# Patient Record
Sex: Male | Born: 1995 | Race: Black or African American | Hispanic: No | Marital: Single | Smoking: Never smoker
Health system: Southern US, Community
[De-identification: ages and names within clinical notes are randomized; demographics above are authoritative.]

## PROBLEM LIST (undated history)

## (undated) DIAGNOSIS — R002 Palpitations: Secondary | ICD-10-CM

## (undated) DIAGNOSIS — J45909 Unspecified asthma, uncomplicated: Secondary | ICD-10-CM

## (undated) HISTORY — PX: NO PAST SURGERIES: SHX2092

## (undated) HISTORY — DX: Palpitations: R00.2

---
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
## 2016-11-20 ENCOUNTER — Emergency Department (HOSPITAL_COMMUNITY)
Admission: EM | Admit: 2016-11-20 | Discharge: 2016-11-21 | Disposition: A | Discharge status: Home / Self Care | Payer: BLUE CROSS/BLUE SHIELD | Attending: Emergency Medicine | Admitting: Emergency Medicine

## 2016-11-20 ENCOUNTER — Encounter (HOSPITAL_COMMUNITY): Payer: Self-pay

## 2016-11-20 DIAGNOSIS — R0602 Shortness of breath: Secondary | ICD-10-CM | POA: Diagnosis present

## 2016-11-20 DIAGNOSIS — Z79899 Other long term (current) drug therapy: Secondary | ICD-10-CM | POA: Diagnosis not present

## 2016-11-20 DIAGNOSIS — R002 Palpitations: Secondary | ICD-10-CM | POA: Insufficient documentation

## 2016-11-20 MED ORDER — ALBUTEROL SULFATE (2.5 MG/3ML) 0.083% IN NEBU
5.0000 mg | INHALATION_SOLUTION | Freq: Once | RESPIRATORY_TRACT | Status: DC
  Filled 2016-11-20: qty 6

## 2016-11-20 NOTE — ED Triage Notes (Addendum)
Pt c/o SOB starting at the beginning of last week. He also states that his chest feels tight and he has been experiencing nasal congestion. States that today, he began experiencing dizziness. A&Ox4. Ambulatory.

## 2016-11-20 NOTE — ED Provider Notes (Signed)
[facility] Provider Note   CSN: [id number] Arrival date & time: 11/20/16  2333 By signing my name below, I, Levon Hedger, attest that this documentation has been prepared under the direction and in the presence of Pricilla Loveless, MD . Electronically Signed: Levon Hedger, Scribe. 11/21/2016. 12:20 AM.   History   Chief Complaint Chief Complaint  Patient presents with  . Shortness of Breath    HPI Guy Campbell is a 21 y.o. male who presents to the Emergency Department complaining of intermittent, recurrent shortness of breath onset one week ago. Per pt, he often experiences this at night before going to bed or before work. He reports associated intermittent  palpitations, dizziness, chest tightness, nonproductive cough, congestion and rhinorrhea. Per pt, he experienced these symptoms 2x tonight, once while watching basketball and again while en route to the ED. He has taken cough drops, Benadryl, and used his inhaler with no significant relief. These episodes are not preceded by the use of his inhaler. Pt states he does not believe his symptoms are related to asthma. Pt  started drinking soda two months ago, and denies any other caffeine intake. No recent travel. He denies any LOC, CP, nausea, vomiting, diarrhea, fever, leg pain or swelling.   The history is provided by the patient. No language interpreter was used.    History reviewed. No pertinent past medical history.  Patient Active Problem List   Diagnosis Date Noted  . Pes planus of both feet 03/21/2014    History reviewed. No pertinent surgical history.   Home Medications    Prior to Admission medications   Medication Sig Start Date End Date Taking? Authorizing Provider  albuterol (PROVENTIL HFA;VENTOLIN HFA) 108 (90 Base) MCG/ACT inhaler Inhale 2 puffs into the lungs every 6 (six) hours as needed. 11/21/16   Pricilla Loveless, MD    Family History History reviewed. No pertinent family history.  Social  History Social History  Substance Use Topics  . Smoking status: Never Smoker  . Smokeless tobacco: Not on file  . Alcohol use No    Allergies   Patient has no known allergies.  Review of Systems Review of Systems  Constitutional: Negative for fever.  Respiratory: Positive for cough, chest tightness and shortness of breath.   Cardiovascular: Positive for palpitations. Negative for chest pain and leg swelling.  Gastrointestinal: Negative for nausea and vomiting.  Musculoskeletal: Negative for myalgias.  Neurological: Positive for dizziness.  All other systems reviewed and are negative.  Physical Exam Updated Vital Signs BP 119/76 (BP Location: Left Arm)   Pulse (!) 55   Temp 97.8 F (36.6 C) (Oral)   Resp 20   Ht  (1.905 m)   Wt 151 lb 9.6 oz (68.8 kg)   SpO2 100%   BMI 18.95 kg/m   Physical Exam  Constitutional: He is oriented to person, place, and time. He appears well-developed and well-nourished.  HENT:  Head: Normocephalic and atraumatic.  Right Ear: External ear normal.  Left Ear: External ear normal.  Nose: Nose normal.  Eyes: Right eye exhibits no discharge. Left eye exhibits no discharge.  Neck: Neck supple.  Cardiovascular: Regular rhythm and normal heart sounds.  Bradycardia present.   Pulses:      Radial pulses are 2+ on the right side, and 2+ on the left side.  Pulmonary/Chest: Effort normal and breath sounds normal. He has no wheezes.  Abdominal: Soft. There is no tenderness.  Musculoskeletal: He exhibits no edema.  Neurological: He is alert  and oriented to person, place, and time.  Skin: Skin is warm and dry.  Nursing note and vitals reviewed.  ED Treatments / Results  DIAGNOSTIC STUDIES:  Oxygen Saturation is 100% on RA, normal by my interpretation.    COORDINATION OF CARE:  12:13 AM Discussed treatment plan with pt at bedside and pt agreed to plan.   Labs (all labs ordered are listed, but only abnormal results are displayed) Labs  Reviewed  BASIC METABOLIC PANEL - Abnormal; Notable for the following:       Result Value   Creatinine, Ser 1.26 (*)    All other components within normal limits  CBC WITH DIFFERENTIAL/PLATELET    EKG  EKG Interpretation  Date/Time:  Sunday November 21 2016 00:20:19 EDT Ventricular Rate:  49 PR Interval:    QRS Duration: 83 QT Interval:  448 QTC Calculation: 405 R Axis:   83 Text Interpretation:  Sinus bradycardia LVH by voltage No old tracing to compare Confirmed by Panfilo Ketchum MD, Gerline Ratto [phone]) on 11/21/2016 12:24:16 AM Also confirmed by Criss Alvine MD, Gini Caputo [phone]), editor Misty Stanley [phone])  on 11/21/2016 7:48:04 AM       Radiology Dg Chest 2 View  Result Date: 11/21/2016 CLINICAL DATA:  Dyspnea and chest tightness EXAM: CHEST  2 VIEW COMPARISON:  None. FINDINGS: The heart size and mediastinal contours are within normal limits. Both lungs are clear. The visualized skeletal structures are unremarkable. IMPRESSION: No active cardiopulmonary disease. Electronically Signed   By: Tollie Eth M.D.   On: 11/21/2016 00:36    Procedures Procedures (including critical care time)  Medications Ordered in ED Medications - No data to display   Initial Impression / Assessment and Plan / ED Course  I have reviewed the triage vital signs and the nursing notes.  Pertinent labs & imaging results that were available during my care of the patient were reviewed by me and considered in my medical decision making (see chart for details).     Patient has remained asymptomatic. No significant arrhythmias noted on telemetry. No clear cause of symptoms. Chart review shows chronic bradycardia. Will recommend cutting back on caffeine, possibly the dayquil/nyquil is contributing. f/u with cardiology as outpatient, likely needs a holter monitor.   Final Clinical Impressions(s) / ED Diagnoses   Final diagnoses:  Palpitations   New Prescriptions Discharge Medication List as of 11/21/2016  1:34  AM     I personally performed the services described in this documentation, which was scribed in my presence. The recorded information has been reviewed and is accurate.    Pricilla Loveless, MD 11/21/16 [phone]

## 2016-11-21 ENCOUNTER — Emergency Department (HOSPITAL_COMMUNITY): Payer: BLUE CROSS/BLUE SHIELD

## 2016-11-21 LAB — BASIC METABOLIC PANEL
Anion gap: 10 (ref 5–15)
BUN: 16 mg/dL (ref 6–20)
CHLORIDE: 104 mmol/L (ref 101–111)
CO2: 25 mmol/L (ref 22–32)
Calcium: 9.3 mg/dL (ref 8.9–10.3)
Creatinine, Ser: 1.26 mg/dL — ABNORMAL HIGH (ref 0.61–1.24)
GFR calc Af Amer: >60 mL/min (ref >60)
GFR calc non Af Amer: >60 mL/min (ref >60)
Glucose, Bld: 87 mg/dL (ref 65–99)
POTASSIUM: 4 mmol/L (ref 3.5–5.1)
Sodium: 139 mmol/L (ref 135–145)

## 2016-11-21 LAB — CBC WITH DIFFERENTIAL/PLATELET
Basophils Absolute: 0 10*3/uL (ref 0.0–0.1)
Basophils Relative: 0 %
EOS PCT: 6 %
Eosinophils Absolute: 0.3 10*3/uL (ref 0.0–0.7)
HCT: 43.1 % (ref 39.0–52.0)
Hemoglobin: 14.8 g/dL (ref 13.0–17.0)
LYMPHS ABS: 1.9 10*3/uL (ref 0.7–4.0)
LYMPHS PCT: 39 %
MCH: 32.8 pg (ref 26.0–34.0)
MCHC: 34.3 g/dL (ref 30.0–36.0)
MCV: 95.6 fL (ref 78.0–100.0)
MONO ABS: 0.5 10*3/uL (ref 0.1–1.0)
Monocytes Relative: 11 %
Neutro Abs: 2.1 10*3/uL (ref 1.7–7.7)
Neutrophils Relative %: 44 %
Platelets: 179 10*3/uL (ref 150–400)
RBC: 4.51 MIL/uL (ref 4.22–5.81)
RDW: 12.1 % (ref 11.5–15.5)
WBC: 4.8 10*3/uL (ref 4.0–10.5)

## 2016-11-21 MED ORDER — ALBUTEROL SULFATE HFA 108 (90 BASE) MCG/ACT IN AERS: 2.0000 | INHALATION_SPRAY | Freq: Four times a day (QID) | RESPIRATORY_TRACT | 1 refills | Status: DC | PRN

## 2016-11-21 NOTE — Discharge Instructions (Signed)
Follow up with cardiology for further evaluation of your palpitations. They may do a Holter Monitor which helps monitor your heart for arrhythmias.

## 2016-11-21 NOTE — ED Notes (Signed)
Patient transported to X-ray 

## 2016-11-21 NOTE — ED Notes (Signed)
ED Provider at bedside. 

## 2018-09-28 IMAGING — CR DG CHEST 2V
2 series · 2 of 2 positions shown · non-contrast
Comparison: None.

CLINICAL DATA: Dyspnea and chest tightness

EXAM:
CHEST  2 VIEW

[w chest pa]
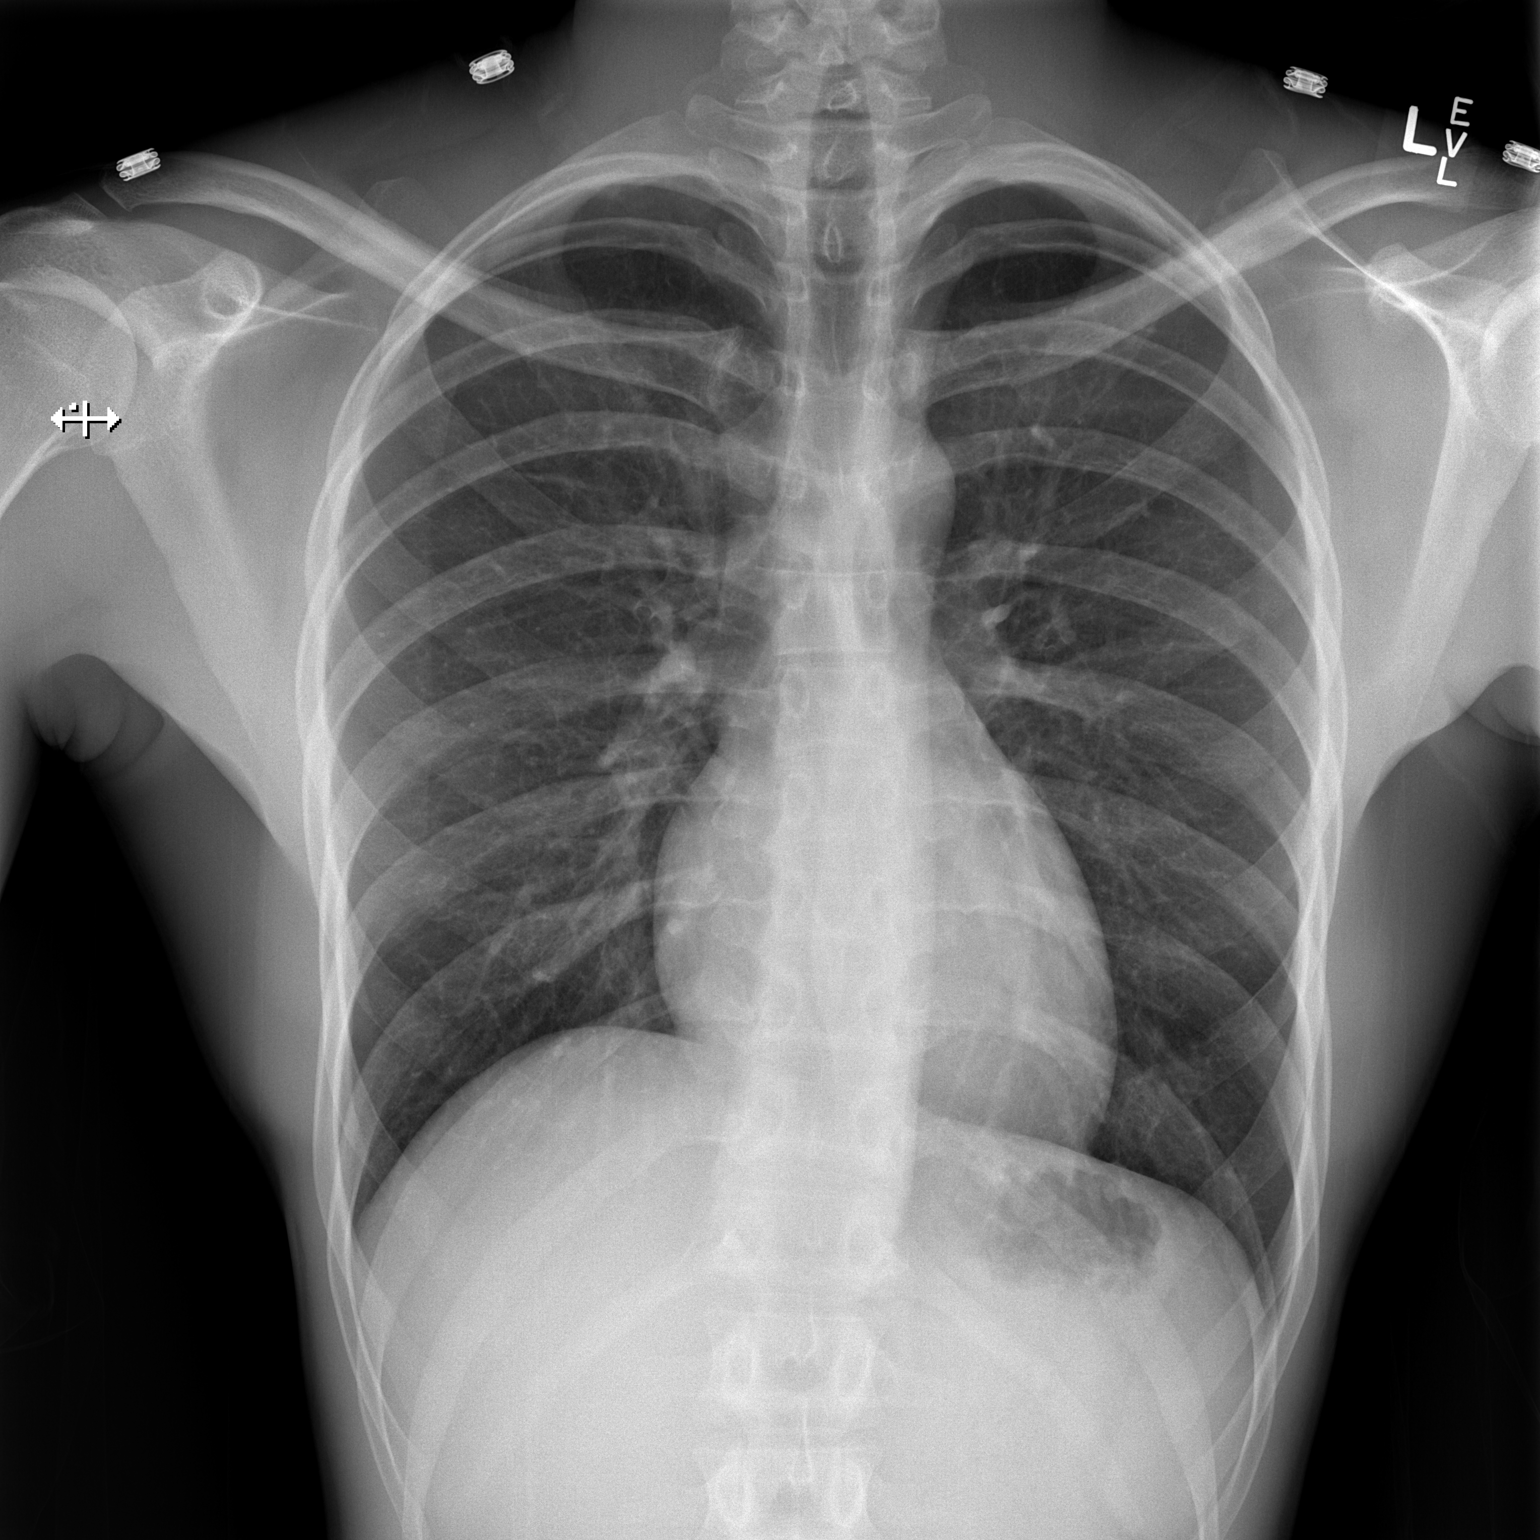

[w chest lat]
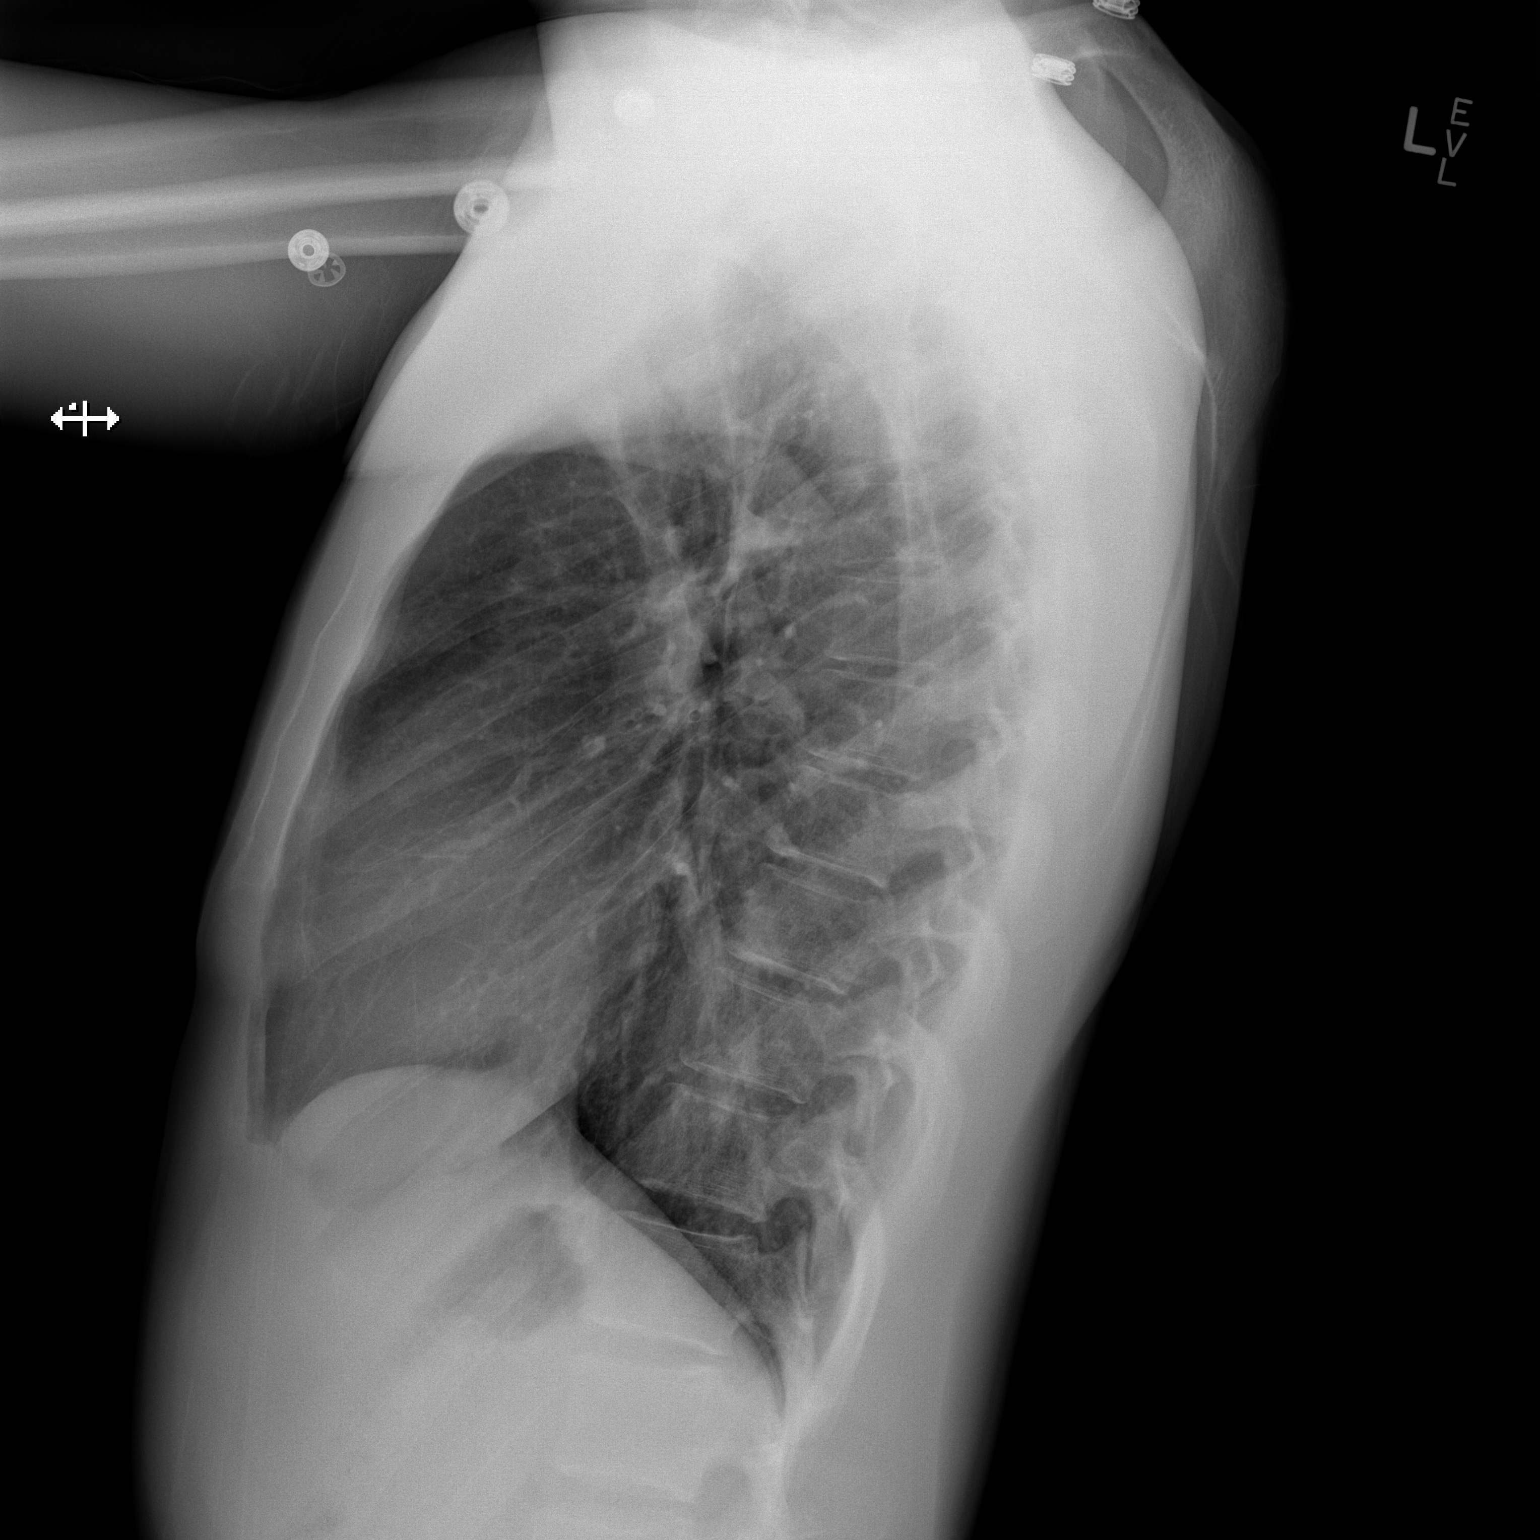

[2 of 2 positions shown; findings below may reference images not displayed]

FINDINGS: The heart size and mediastinal contours are within normal limits.
Both lungs are clear. The visualized skeletal structures are
unremarkable.
IMPRESSION: No active cardiopulmonary disease.

## 2022-02-20 ENCOUNTER — Ambulatory Visit (HOSPITAL_COMMUNITY)
Admission: EM | Admit: 2022-02-20 | Discharge: 2022-02-20 | Disposition: A | Discharge status: Home / Self Care | Payer: BC Managed Care – PPO

## 2022-02-20 ENCOUNTER — Encounter (HOSPITAL_COMMUNITY): Payer: Self-pay

## 2022-02-20 DIAGNOSIS — M542 Cervicalgia: Secondary | ICD-10-CM | POA: Diagnosis not present

## 2022-02-20 HISTORY — DX: Unspecified asthma, uncomplicated: J45.909

## 2022-02-20 NOTE — Discharge Instructions (Addendum)
I recommend ibuprofen 800 mg every 6 hours. Try warm compress or ice to the area. Gentle stretching of the neck, light massage.  Please follow up with your primary care provider.

## 2022-02-20 NOTE — ED Triage Notes (Signed)
Pt states he has a tingling feeling in his left neck that goes into his left face and shoulder on and off for the past 2 days. States he went to the chiropractor today for an adjustment with no relief.

## 2022-02-21 NOTE — ED Provider Notes (Signed)
[facility]    CSN: [id number] Arrival date & time: 02/20/22  1709      History   Chief Complaint Chief Complaint  Patient presents with   Neck Pain    HPI Guy Campbell is a 26 y.o. male.  Presents with 2-day history of tingling feeling on the left side of his neck.  Feels it is also in his left face.  It is very subtle, denies any pain.  He has no problems with neck and shoulder range of motion. Has not tried anything for his symptoms.  Went to chiropractor earlier for adjustment without relief.  Denies fever, chills, vision changes, blurry vision, burning sensation or rash, hearing changes, sore throat or mouth pain, cough, shortness of breath, abdominal pain, vomiting/diarrhea.  Only symptom is the sensation of tingling on the left neck.  Past Medical History:  Diagnosis Date   Asthma     Patient Active Problem List   Diagnosis Date Noted   Pes planus of both feet 03/21/2014    History reviewed. No pertinent surgical history.     Home Medications    Prior to Admission medications   Medication Sig Start Date End Date Taking? Authorizing Provider  albuterol (PROVENTIL HFA;VENTOLIN HFA) 108 (90 Base) MCG/ACT inhaler Inhale 2 puffs into the lungs every 6 (six) hours as needed. 11/21/16   Pricilla Loveless, MD    Family History History reviewed. No pertinent family history.  Social History Social History   Tobacco Use   Smoking status: Never  Substance Use Topics   Alcohol use: No   Drug use: No     Allergies   Patient has no known allergies.   Review of Systems Review of Systems   Physical Exam Triage Vital Signs ED Triage Vitals  Enc Vitals Group     BP 02/20/22 1730 (!) 154/88     Pulse Rate 02/20/22 1730 (!) 58     Resp 02/20/22 1730 16     Temp 02/20/22 1730 98.2 F (36.8 C)     Temp Source 02/20/22 1730 Oral     SpO2 02/20/22 1730 98 %     Weight --      Height --      Head Circumference --      Peak Flow --      Pain  Score 02/20/22 1731 0     Pain Loc --      Pain Edu? --      Excl. in GC? --    No data found.  Updated Vital Signs BP (!) 154/88 (BP Location: Left Arm)   Pulse (!) 58   Temp 98.2 F (36.8 C) (Oral)   Resp 16   SpO2 98%    Physical Exam Vitals and nursing note reviewed.  Constitutional:      General: He is not in acute distress.    Appearance: Normal appearance.  HENT:     Head: Normocephalic and atraumatic.     Right Ear: Tympanic membrane and ear canal normal.     Left Ear: Tympanic membrane and ear canal normal.     Nose: Nose normal.     Mouth/Throat:     Mouth: Mucous membranes are moist.     Pharynx: Oropharynx is clear.  Eyes:     Extraocular Movements: Extraocular movements intact.     Conjunctiva/sclera: Conjunctivae normal.     Pupils: Pupils are equal, round, and reactive to light.  Neck:     Trachea: Trachea  normal.  Cardiovascular:     Rate and Rhythm: Normal rate and regular rhythm.     Heart sounds: Normal heart sounds.  Pulmonary:     Effort: Pulmonary effort is normal. No respiratory distress.     Breath sounds: Normal breath sounds.  Abdominal:     Palpations: Abdomen is soft.     Tenderness: There is no abdominal tenderness.  Musculoskeletal:        General: Normal range of motion.     Right shoulder: Normal.     Left shoulder: Normal.     Cervical back: Normal, full passive range of motion without pain and normal range of motion. No rigidity or bony tenderness. No pain with movement, spinous process tenderness or muscular tenderness. Normal range of motion.  Lymphadenopathy:     Cervical: No cervical adenopathy.  Neurological:     General: No focal deficit present.     Mental Status: He is alert and oriented to person, place, and time.     Cranial Nerves: Cranial nerves 2-12 are intact. No cranial nerve deficit or facial asymmetry.     Sensory: Sensation is intact. No sensory deficit.     Motor: Motor function is intact. No weakness, tremor  or atrophy.     Coordination: Coordination is intact. Coordination normal.     Gait: Gait is intact. Gait normal.     Comments: Strength 5/5 all extremities, pulses strong and sensation intact throughout     UC Treatments / Results  Labs (all labs ordered are listed, but only abnormal results are displayed) Labs Reviewed - No data to display  EKG  Radiology No results found.  Procedures Procedures  Medications Ordered in UC Medications - No data to display  Initial Impression / Assessment and Plan / UC Course  I have reviewed the triage vital signs and the nursing notes.  Pertinent labs & imaging results that were available during my care of the patient were reviewed by me and considered in my medical decision making (see chart for details).  Unclear etiology but he is overall well-appearing with no neurological deficit, no obvious rash or skin changes of the face or neck.  No muscular pain.  Could be some sort of nerve issue versus inflammation.  I set him up with a primary care provider, appointment for next week.  Discussed with patient that primary care will be the moderator for whatever specialty he may need to see, ear specialist versus orthopedics versus neurology etc if symptoms are persistent. In the meantime I recommend ibuprofen, warm compress or ice to the area.  Gentle stretching and light massage may provide some relief.  He will try symptomatic care at home, watch and wait for any change in symptoms.  Strict return precautions.  Patient agrees to plan and he is discharged in stable condition.  Final Clinical Impressions(s) / UC Diagnoses   Final diagnoses:  Neck pain on left side     Discharge Instructions      I recommend ibuprofen 800 mg every 6 hours. Try warm compress or ice to the area. Gentle stretching of the neck, light massage.  Please follow up with your primary care provider.    ED Prescriptions   None    PDMP not reviewed this  encounter.   Zanae Kuehnle, Lurena Joiner, PA-C 02/21/22 1011

## 2022-02-22 ENCOUNTER — Encounter: Payer: Self-pay | Admitting: Family

## 2022-02-22 ENCOUNTER — Ambulatory Visit (INDEPENDENT_AMBULATORY_CARE_PROVIDER_SITE_OTHER): Payer: BC Managed Care – PPO | Admitting: Family

## 2022-02-22 VITALS — BP 116/70 | HR 66 | Temp 98.8°F | Resp 16 | Ht 73.0 in | Wt 154.5 lb

## 2022-02-22 DIAGNOSIS — Z13220 Encounter for screening for lipoid disorders: Secondary | ICD-10-CM | POA: Insufficient documentation

## 2022-02-22 DIAGNOSIS — J4520 Mild intermittent asthma, uncomplicated: Secondary | ICD-10-CM

## 2022-02-22 DIAGNOSIS — R002 Palpitations: Secondary | ICD-10-CM | POA: Insufficient documentation

## 2022-02-22 DIAGNOSIS — R001 Bradycardia, unspecified: Secondary | ICD-10-CM

## 2022-02-22 DIAGNOSIS — F411 Generalized anxiety disorder: Secondary | ICD-10-CM

## 2022-02-22 DIAGNOSIS — R1013 Epigastric pain: Secondary | ICD-10-CM | POA: Insufficient documentation

## 2022-02-22 MED ORDER — OMEPRAZOLE 20 MG PO CPDR: 20.0000 mg | DELAYED_RELEASE_CAPSULE | Freq: Every day | ORAL | 3 refills | Status: DC

## 2022-02-22 MED ORDER — ESCITALOPRAM OXALATE 10 MG PO TABS: 10.0000 mg | ORAL_TABLET | Freq: Every day | ORAL | 1 refills | Status: DC

## 2022-02-22 MED ORDER — HYDROXYZINE PAMOATE 50 MG PO CAPS: 50.0000 mg | ORAL_CAPSULE | Freq: Three times a day (TID) | ORAL | 0 refills | Status: DC | PRN

## 2022-02-22 NOTE — Progress Notes (Signed)
New Patient Office Visit  Subjective:  Patient ID: Guy Campbell, male    DOB: 01-30-1996  Age: 26 y.o. MRN: [id number]  CC:  Chief Complaint  Patient presents with   Establish Care   Abdominal Pain    Started last night. Not pain just uncomfotable    HPI Guy Campbell is here to establish care as a new patient.  Prior provider was:has been for a while.  Pt is without acute concerns.   Last week with some abdominal bloating, and slightly over the last one week. Does feel heart burn at times, not everyday. Does eat a lot of fast food, fried fatty foods. Not much spicy foods. No constipation or diarrhea. Some extra gas at times, not all of the time. Not excessive burping. No nausea. No vomiting.   Last night started with a feeling of abdominal fullness. He states it is a 'tightness' but not cramping. Denies dull aching pain. No zapping pain.   Did have an innocent heart murmur as a child that he states he grew out of . No chest pain. Palpitations at times but not recent. No energy drinks. Does drink soda, but sprite or ginger ale , but not often at all. Otherwise water juice lemonade and or sweet tea.   Asthma: does not feel he had an episode maybe since 2020?    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  Tobacco Use   Smoking status: Never   Smokeless tobacco: Never  Vaping Use   Vaping Use: Never used  Substance and Sexual Activity    Alcohol use: No   Drug use: No   Sexual activity: Yes    Partners: Female    Birth control/protection: Condom  Other Topics Concern   Not on file  Social History Narrative   Not on file   Social Determinants of Health   Financial Resource Strain: Not on file  Food Insecurity: Not on file  Transportation Needs: Not on file  Physical Activity: Not on file  Stress: Not on file  Social Connections: Not on file  Intimate Partner Violence: Not on file    Outpatient Medications Prior to Visit  Medication Sig Dispense Refill   albuterol (PROVENTIL HFA;VENTOLIN HFA) 108 (90 Base) MCG/ACT inhaler Inhale 2 puffs into the lungs every 6 (six) hours as needed. 1 Inhaler 1   WHEY PROTEIN CONCENTRATE PO Take by mouth.     No facility-administered medications prior to visit.    No Known Allergies     Objective:    Physical Exam Constitutional:      General: He is not in acute distress.    Appearance: Normal appearance. He is normal weight. He is not ill-appearing, toxic-appearing or diaphoretic.  HENT:     Head: Normocephalic.     Right Ear: Tympanic membrane  normal.     Left Ear: Tympanic membrane normal.     Nose: Nose normal.  Eyes:     Pupils: Pupils are equal, round, and reactive to light.  Cardiovascular:     Rate and Rhythm: Normal rate and regular rhythm. Occasional Extrasystoles are present.    Pulses: Normal pulses.     Heart sounds: No murmur heard. Pulmonary:     Effort: Pulmonary effort is normal.     Breath sounds: Normal breath sounds.  Abdominal:     General: Abdomen is flat. Bowel sounds are normal.     Palpations: Abdomen is soft.     Tenderness: There is no abdominal tenderness.  Musculoskeletal:        General: Normal range of motion.     Cervical back: Normal range of motion.  Skin:    General: Skin is warm.  Neurological:     General: No focal deficit present.     Mental Status: He is alert and oriented to person, place, and time.     Motor: No  weakness.     Gait: Gait normal.  Psychiatric:        Mood and Affect: Mood normal.        Behavior: Behavior normal.        Thought Content: Thought content normal.        Judgment: Judgment normal.     BP 116/70   Pulse 66   Temp 98.8 F (37.1 C)   Resp 16   Ht 6\' 1"  (1.854 m)   Wt 154 lb 8 oz (70.1 kg)   SpO2 98%   BMI 20.38 kg/m  Wt Readings from Last 3 Encounters:  02/22/22 154 lb 8 oz (70.1 kg)  11/20/16 151 lb 9.6 oz (68.8 kg)  03/21/14 160 lb (72.6 kg) (64 %, Z= 0.37)*   * Growth percentiles are based on CDC (Boys, 2-20 Years) data.     Health Maintenance Due  Topic Date Due   HPV VACCINES (1 - Male 2-dose series) Never done   HIV Screening  Never done   Hepatitis C Screening  Never done   TETANUS/TDAP  Never done       Topic Date Due   HPV VACCINES (1 - Male 2-dose series) Never done    No results found for: "TSH" Lab Results  Component Value Date   WBC 4.8 11/21/2016   HGB 14.8 11/21/2016   HCT 43.1 11/21/2016   MCV 95.6 11/21/2016   PLT 179 11/21/2016   Lab Results  Component Value Date   NA 139 11/21/2016   K 4.0 11/21/2016   CO2 25 11/21/2016   GLUCOSE 87 11/21/2016   BUN 16 11/21/2016   CREATININE 1.26 (H) 11/21/2016   CALCIUM 9.3 11/21/2016   ANIONGAP 10 11/21/2016   No results found for: "CHOL" No results found for: "HDL" No results found for: "LDLCALC" No results found for: "TRIG" No results found for: "CHOLHDL" No results found for: "HGBA1C"    Assessment & Plan:   Problem List Items Addressed This Visit       Respiratory   Mild intermittent asthma without complication - Primary    Stable for now will continue to monitor prn         Other   Palpitations    ekg today  Go easy and or if not decrease caffeine intake as well       Relevant Orders   EKG 12-Lead (Completed)   Ambulatory referral to  Cardiology   TSH   Comprehensive metabolic panel   CBC with Differential/Platelet   Epigastric pain    Likely diet   Try to decrease and or avoid spicy foods, fried fatty foods, and also caffeine and chocolate as these can increase heartburn symptoms.  Start trial omeprazole       Relevant Medications   omeprazole (PRILOSEC) 20 MG capsule   Bradycardia    Bradycardia on ekg  Pt does report palpitations  Possible PAC on auscultation, could be normal for age? However since ongoing will send to cardiologist for eval/treat      Relevant Orders   Ambulatory referral to Cardiology   Anxiety state    We discussed options, pt declines therapy.   I instructed pt to start lexapro 1/2 tablet once daily for 1 week and then increase to a full tablet once daily on week two as tolerated.  We discussed common side effects such as nausea, drowsiness and weight gain.  Also discussed rare but serious side effect of suicidal ideation.  She is instructed to discontinue medication and go directly to ED if this occurs.  Pt verbalizes understanding.  Plan is to follow up in 30 days to evaluate progress.          Relevant Medications   hydrOXYzine (VISTARIL) 50 MG capsule   escitalopram (LEXAPRO) 10 MG tablet   Screening for lipoid disorders    Lipid panel ordered pending results.        Relevant Orders   Lipid panel    Meds ordered this encounter  Medications   omeprazole (PRILOSEC) 20 MG capsule    Sig: Take 1 capsule (20 mg total) by mouth daily.    Dispense:  30 capsule    Refill:  3    Order Specific Question:   Supervising Provider    Answer:   BEDSOLE, AMY E [[id number]]   hydrOXYzine (VISTARIL) 50 MG capsule    Sig: Take 1 capsule (50 mg total) by mouth 3 (three) times daily as needed.    Dispense:  30 capsule    Refill:  0    Order Specific Question:   Supervising Provider    Answer:   BEDSOLE, AMY E [[id number]]   escitalopram (LEXAPRO) 10 MG tablet    Sig: Take 1 tablet (10 mg total) by mouth daily.    Dispense:  30 tablet    Refill:  1    Order Specific Question:   Supervising Provider    Answer:    Ermalene Searing, AMY E [[id number]]    Follow-up: Return in about 1 month (around 03/25/2022) for regular follow up on medication .    Mort Sawyers, FNP

## 2022-02-22 NOTE — Assessment & Plan Note (Signed)
Bradycardia on ekg  Pt does report palpitations  Possible PAC on auscultation, could be normal for age? However since ongoing will send to cardiologist for eval/treat

## 2022-02-22 NOTE — Assessment & Plan Note (Signed)
Stable for now will continue to monitor prn

## 2022-02-22 NOTE — Patient Instructions (Addendum)
Trial omeprazole 20 mg once daily for the next weeks  Please start trial of omeprazole as prescribed. Take this medication for two weeks, administer 30 minutes prior to breakfast each am. Try to decrease and or avoid spicy foods, fried fatty foods, and also caffeine and chocolate as these can increase heartburn symptoms.   A referral was placed today for a cardiologist.  Please let us know if you have not heard back within 2 weeks about the referral.  Start lexapro 10 mg for anxiety and depression. Take 1/2 tablet by mouth once daily for about one week, then increase to 1 full tablet thereafter.   Taking the medicine as directed and not missing any doses is one of the best things you can do to treat your anxiety.  Here are some things to keep in mind:  Side effects (stomach upset, some increased anxiety) may happen before you notice a benefit.  These side effects typically go away over time. Changes to your dose of medicine or a change in medication all together is sometimes necessary Many people will notice an improvement within two weeks but the full effect of the medication can take up to 4-6 weeks Stopping the medication when you start feeling better often results in a return of symptoms. Most people need to be on medication at least 6-12 months If you start having thoughts of hurting yourself or others after starting this medicine, please call me immediately.     Welcome to our clinic, I am happy to have you as my new patient. I am excited to continue on this healthcare journey with you.  Stop by the lab prior to leaving today. I will notify you of your results once received.   Please keep in mind Any my chart messages you send have up to a three business day turnaround for a response.  Phone calls may take up to a one full business day turnaround for a  response.   If you need a medication refill I recommend you request it through the pharmacy as this is easiest for [place] rather than  sending a message and or phone call.   Due to recent changes in healthcare laws, you may see results of your imaging and/or laboratory studies on MyChart before I have had a chance to review them.  I understand that in some cases there may be results that are confusing or concerning to you. Please understand that not all results are received at the same time and often I may need to interpret multiple results in order to provide you with the best plan of care or course of treatment. Therefore, I ask that you please give me 2 business days to thoroughly review all your results before contacting my office for clarification. Should we see a critical lab result, you will be contacted sooner.   It was a pleasure seeing you today! Please do not hesitate to reach out with any questions and or concerns.  Regards,   Mort Sawyers FNP-C

## 2022-02-22 NOTE — Assessment & Plan Note (Signed)
Likely diet  Try to decrease and or avoid spicy foods, fried fatty foods, and also caffeine and chocolate as these can increase heartburn symptoms.  Start trial omeprazole

## 2022-02-22 NOTE — Assessment & Plan Note (Signed)
ekg today  Go easy and or if not decrease caffeine intake as well

## 2022-02-22 NOTE — Progress Notes (Signed)
Bradycardia, reviewed with pt in office.

## 2022-02-22 NOTE — Assessment & Plan Note (Signed)
We discussed options, pt declines therapy.   I instructed pt to start lexapro 1/2 tablet once daily for 1 week and then increase to a full tablet once daily on week two as tolerated.  We discussed common side effects such as nausea, drowsiness and weight gain.  Also discussed rare but serious side effect of suicidal ideation.  She is instructed to discontinue medication and go directly to ED if this occurs.  Pt verbalizes understanding.  Plan is to follow up in 30 days to evaluate progress.

## 2022-02-22 NOTE — Assessment & Plan Note (Signed)
Lipid panel ordered pending results.   

## 2022-02-23 LAB — CBC WITH DIFFERENTIAL/PLATELET
Basophils Absolute: 0 10*3/uL (ref 0.0–0.1)
Basophils Relative: 0.9 % (ref 0.0–3.0)
Eosinophils Absolute: 0 10*3/uL (ref 0.0–0.7)
Eosinophils Relative: 0.6 % (ref 0.0–5.0)
HCT: 48 % (ref 39.0–52.0)
Hemoglobin: 16.1 g/dL (ref 13.0–17.0)
Lymphocytes Relative: 34.3 % (ref 12.0–46.0)
Lymphs Abs: 1.3 10*3/uL (ref 0.7–4.0)
MCHC: 33.5 g/dL (ref 30.0–36.0)
MCV: 96.9 fl (ref 78.0–100.0)
Monocytes Absolute: 0.4 10*3/uL (ref 0.1–1.0)
Monocytes Relative: 9.3 % (ref 3.0–12.0)
Neutro Abs: 2.1 10*3/uL (ref 1.4–7.7)
Neutrophils Relative %: 54.9 % (ref 43.0–77.0)
Platelets: 217 10*3/uL (ref 150.0–400.0)
RBC: 4.96 Mil/uL (ref 4.22–5.81)
RDW: 12.3 % (ref 11.5–15.5)
WBC: 3.9 10*3/uL — ABNORMAL LOW (ref 4.0–10.5)

## 2022-02-23 LAB — LIPID PANEL
Cholesterol: 203 mg/dL — ABNORMAL HIGH (ref 0–200)
HDL: 69.7 mg/dL (ref >39.00)
LDL Cholesterol: 123 mg/dL — ABNORMAL HIGH (ref 0–99)
NonHDL: 133.62
Total CHOL/HDL Ratio: 3
Triglycerides: 53 mg/dL (ref 0.0–149.0)
VLDL: 10.6 mg/dL (ref 0.0–40.0)

## 2022-02-23 LAB — TSH: TSH: 0.88 u[IU]/mL (ref 0.35–5.50)

## 2022-02-23 LAB — COMPREHENSIVE METABOLIC PANEL
ALT: 14 U/L (ref 0–53)
AST: 18 U/L (ref 0–37)
Albumin: 5.2 g/dL (ref 3.5–5.2)
Alkaline Phosphatase: 48 U/L (ref 39–117)
BUN: 12 mg/dL (ref 6–23)
CO2: 29 mEq/L (ref 19–32)
Calcium: 10.1 mg/dL (ref 8.4–10.5)
Chloride: 102 mEq/L (ref 96–112)
Creatinine, Ser: 1.23 mg/dL (ref 0.40–1.50)
GFR: 81.05 mL/min (ref >60.00)
Glucose, Bld: 82 mg/dL (ref 70–99)
Potassium: 4.2 mEq/L (ref 3.5–5.1)
Sodium: 138 mEq/L (ref 135–145)
Total Bilirubin: 0.5 mg/dL (ref 0.2–1.2)
Total Protein: 7.9 g/dL (ref 6.0–8.3)

## 2022-02-23 NOTE — Progress Notes (Signed)
Cholesterol borderline, work on diet. And exercise as tolerated. Wbc low but this is new. Will repeat one month. Thyroid ok. Cmp ok.   We will repeat cbc at f/u in august

## 2022-02-25 ENCOUNTER — Encounter: Payer: Self-pay | Admitting: Emergency Medicine

## 2022-02-25 ENCOUNTER — Ambulatory Visit
Admission: EM | Admit: 2022-02-25 | Discharge: 2022-02-25 | Disposition: A | Discharge status: Home / Self Care | Payer: BC Managed Care – PPO | Attending: Emergency Medicine | Admitting: Emergency Medicine

## 2022-02-25 ENCOUNTER — Other Ambulatory Visit: Payer: Self-pay

## 2022-02-25 ENCOUNTER — Ambulatory Visit
Admission: EM | Admit: 2022-02-25 | Discharge: 2022-02-25 | Disposition: A | Discharge status: Home / Self Care | Payer: BC Managed Care – PPO

## 2022-02-25 DIAGNOSIS — R0982 Postnasal drip: Secondary | ICD-10-CM | POA: Diagnosis not present

## 2022-02-25 DIAGNOSIS — J309 Allergic rhinitis, unspecified: Secondary | ICD-10-CM | POA: Diagnosis not present

## 2022-02-25 DIAGNOSIS — J310 Chronic rhinitis: Secondary | ICD-10-CM | POA: Diagnosis not present

## 2022-02-25 DIAGNOSIS — R197 Diarrhea, unspecified: Secondary | ICD-10-CM

## 2022-02-25 DIAGNOSIS — R0981 Nasal congestion: Secondary | ICD-10-CM

## 2022-02-25 DIAGNOSIS — J329 Chronic sinusitis, unspecified: Secondary | ICD-10-CM

## 2022-02-25 DIAGNOSIS — F418 Other specified anxiety disorders: Secondary | ICD-10-CM

## 2022-02-25 DIAGNOSIS — R4589 Other symptoms and signs involving emotional state: Secondary | ICD-10-CM

## 2022-02-25 MED ORDER — CETIRIZINE HCL 10 MG PO TABS: 10.0000 mg | ORAL_TABLET | Freq: Every day | ORAL | 1 refills | Status: DC

## 2022-02-25 MED ORDER — FLUTICASONE PROPIONATE 50 MCG/ACT NA SUSP: 1.0000 | Freq: Every day | NASAL | 1 refills | Status: DC

## 2022-02-25 MED ORDER — LOPERAMIDE HCL 2 MG PO TABS: 2.0000 mg | ORAL_TABLET | Freq: Four times a day (QID) | ORAL | 0 refills | Status: DC | PRN

## 2022-02-25 NOTE — ED Triage Notes (Signed)
Pt here for nasal congestion and some diarrhea x 1 week; pt seen earlier today for same

## 2022-02-25 NOTE — ED Triage Notes (Signed)
Pt here with sinus pressure, ear fullness, post nasal drip and headache x 1 week.

## 2022-02-25 NOTE — Discharge Instructions (Signed)
I agree with the provider that you saw earlier today.  Continue medications that were previously prescribed.  Symptoms should run their course and resolve.  Recommend that you follow-up with provider that prescribes that new medication to let them know that you are having diarrhea.  Ensure adequate fluid hydration.  Follow-up if symptoms persist or worsen.

## 2022-02-25 NOTE — ED Provider Notes (Signed)
[name]    CSN: [id number] Arrival date & time: 02/25/22  0102      History   Chief Complaint Chief Complaint  Patient presents with   Nasal Congestion    HPI Guy Campbell is a 26 y.o. male.   Patient presents with nasal congestion, sinus pressure, postnasal drip, diarrhea that started about 5 to 6 days ago.  Denies cough, sore throat, nausea, vomiting, abdominal pain, fever.  Denies any known sick contacts.  Patient was seen at a different urgent care earlier today for same symptoms.  He was prescribed a few different medications for symptoms as he was thought to have rhinosinusitis versus allergic rhinitis.  He states that he has used a nasal spray with some improvement.  He is very vague about why he is seeking additional care but just keeps stating that "symptoms are just going all over his body".  Patient is attributing diarrhea to starting a new medication for anxiety and PCP a few days prior.  Denies blood in stool.  He does report some "abdominal tightness" a few days prior but denies abdominal pain.  He is ensuring adequate fluid hydration and is able to keep fluids down.     Past Medical History:  Diagnosis Date   Asthma     Patient Active Problem List   Diagnosis Date Noted   Mild intermittent asthma without complication 02/22/2022   Palpitations 02/22/2022   Epigastric pain 02/22/2022   Bradycardia 02/22/2022   Anxiety state 02/22/2022   Screening for lipoid disorders 02/22/2022    Past Surgical History:  Procedure Laterality Date   NO PAST SURGERIES         Home Medications    Prior to Admission medications   Medication Sig Start Date End Date Taking? Authorizing Provider  albuterol (PROVENTIL HFA;VENTOLIN HFA) 108 (90 Base) MCG/ACT inhaler Inhale 2 puffs into the lungs every 6 (six) hours as needed. 11/21/16   Pricilla Loveless, MD  cetirizine (ZYRTEC ALLERGY) 10 MG tablet Take 1 tablet (10 mg total) by mouth at bedtime. 02/25/22  08/24/22  Theadora Rama Scales, PA-C  escitalopram (LEXAPRO) 10 MG tablet Take 1 tablet (10 mg total) by mouth daily. 02/22/22 04/23/22  Mort Sawyers, FNP  fluticasone (FLONASE) 50 MCG/ACT nasal spray Place 1 spray into both nostrils daily. 02/25/22   Theadora Rama Scales, PA-C  hydrOXYzine (VISTARIL) 50 MG capsule Take 1 capsule (50 mg total) by mouth 3 (three) times daily as needed. 02/22/22   Mort Sawyers, FNP  loperamide (IMODIUM A-D) 2 MG tablet Take 1 tablet (2 mg total) by mouth 4 (four) times daily as needed for diarrhea or loose stools. 02/25/22   Theadora Rama Scales, PA-C  omeprazole (PRILOSEC) 20 MG capsule Take 1 capsule (20 mg total) by mouth daily. 02/22/22   Mort Sawyers, FNP  WHEY PROTEIN CONCENTRATE PO Take by mouth.    [provider]    Family History Family History  Problem Relation Age of Onset   Hypertension Mother    Atrial fibrillation Father    Multiple sclerosis Paternal Grandmother     Social History Social History   Tobacco Use   Smoking status: Never   Smokeless tobacco: Never  Vaping Use   Vaping Use: Never used  Substance Use Topics   Alcohol use: No   Drug use: No     Allergies   Patient has no known allergies.   Review of Systems Review of Systems Per HPI  Physical Exam Triage Vital  Signs ED Triage Vitals  Enc Vitals Group     BP 02/25/22 1842 (!) 146/77     Pulse Rate 02/25/22 1842 60     Resp 02/25/22 1842 18     Temp 02/25/22 1842 98.6 F (37 C)     Temp Source 02/25/22 1842 Oral     SpO2 02/25/22 1842 98 %     Weight --      Height --      Head Circumference --      Peak Flow --      Pain Score 02/25/22 1843 5     Pain Loc --      Pain Edu? --      Excl. in GC? --    No data found.  Updated Vital Signs BP (!) 146/77 (BP Location: Left Arm)   Pulse 60   Temp 98.6 F (37 C) (Oral)   Resp 18   SpO2 98%   Visual Acuity Right Eye Distance:   Left Eye Distance:   Bilateral Distance:    Right Eye  Near:   Left Eye Near:    Bilateral Near:     Physical Exam Constitutional:      General: He is not in acute distress.    Appearance: Normal appearance. He is not toxic-appearing or diaphoretic.  HENT:     Head: Normocephalic and atraumatic.     Right Ear: Ear canal normal. No drainage, swelling or tenderness. A middle ear effusion is present. No mastoid tenderness. Tympanic membrane is not perforated, erythematous or bulging.     Left Ear: Ear canal normal. No drainage, swelling or tenderness. A middle ear effusion is present. No mastoid tenderness. Tympanic membrane is not perforated, erythematous or bulging.     Nose: Congestion present.     Mouth/Throat:     Mouth: Mucous membranes are moist.     Pharynx: No posterior oropharyngeal erythema.  Eyes:     Extraocular Movements: Extraocular movements intact.     Conjunctiva/sclera: Conjunctivae normal.     Pupils: Pupils are equal, round, and reactive to light.  Cardiovascular:     Rate and Rhythm: Normal rate and regular rhythm.     Pulses: Normal pulses.     Heart sounds: Normal heart sounds.  Pulmonary:     Effort: Pulmonary effort is normal. No respiratory distress.     Breath sounds: Normal breath sounds. No stridor. No wheezing, rhonchi or rales.  Abdominal:     General: Abdomen is flat. Bowel sounds are normal. There is no distension.     Palpations: Abdomen is soft.     Tenderness: There is no abdominal tenderness.  Musculoskeletal:        General: Normal range of motion.     Cervical back: Normal range of motion.  Skin:    General: Skin is warm and dry.  Neurological:     General: No focal deficit present.     Mental Status: He is alert and oriented to person, place, and time. Mental status is at baseline.  Psychiatric:        Mood and Affect: Mood normal.        Behavior: Behavior normal.      UC Treatments / Results  Labs (all labs ordered are listed, but only abnormal results are displayed) Labs Reviewed -  No data to display  EKG   Radiology No results found.  Procedures Procedures (including critical care time)  Medications Ordered in UC Medications - No  data to display  Initial Impression / Assessment and Plan / UC Course  I have reviewed the triage vital signs and the nursing notes.  Pertinent labs & imaging results that were available during my care of the patient were reviewed by me and considered in my medical decision making (see chart for details).     Differential diagnoses include viral upper respiratory infection versus allergic rhinitis versus rhinosinusitis.  Do not suspect any bacterial component to patient's symptoms.  Patient appears to be well-hydrated and no signs of dehydration related to diarrhea.  Diarrhea could be related to viral illness and if it is present as well as medication adverse reaction.  Advised patient to follow-up with PCP to notify them of possible adverse reaction, although there is low suspicion for this adverse reaction.  Agree with previous provider's treatment plan at previous urgent care today, although Imodium may need to be limited given that he takes Lexapro.  Advised to ensure adequate fluid hydration.  Patient was given strict return precautions.  Patient verbalized understanding and was agreeable with plan. Final Clinical Impressions(s) / UC Diagnoses   Final diagnoses:  Nasal congestion  Post-nasal drip  Diarrhea, unspecified type     Discharge Instructions      I agree with the provider that you saw earlier today.  Continue medications that were previously prescribed.  Symptoms should run their course and resolve.  Recommend that you follow-up with provider that prescribes that new medication to let them know that you are having diarrhea.  Ensure adequate fluid hydration.  Follow-up if symptoms persist or worsen.    ED Prescriptions   None    PDMP not reviewed this encounter.   Gustavus Bryant, [place] 02/25/22 [phone]

## 2022-02-25 NOTE — ED Provider Notes (Signed)
[facility]    CSN: [id number] Arrival date & time: 02/25/22  1156    HISTORY   Chief Complaint  Patient presents with   Headache   Nasal Congestion   Ear Fullness   HPI Guy Campbell is a pleasant, 26 y.o. male who presents to urgent care today. Patient complains of sinus pressure without pain, postnasal drip and mild, nonspecific headache for the past week.  Patient states he can feel drainage from over his ears running down his neck and into his abdomen.  Patient states he also feels drainage running down the back of his throat.  Patient reports a history of asthma and allergies however not currently taking medications for either issue.  Patient also states he had an episode of diarrhea yesterday.  Patient states he saw his PCP 6 days ago, states he thought they had prescribed him medication for anxiety however it did not help his anxiety and he believes that it caused him to have diarrhea so he is no longer taking it.  The history is provided by the patient.   Past Medical History:  Diagnosis Date   Asthma    Patient Active Problem List   Diagnosis Date Noted   Mild intermittent asthma without complication [id number]   Palpitations 02/22/2022   Epigastric pain 02/22/2022   Bradycardia 02/22/2022   Anxiety state 02/22/2022   Screening for lipoid disorders 02/22/2022   Past Surgical History:  Procedure Laterality Date   NO PAST SURGERIES      Home Medications    Prior to Admission medications   Medication Sig Start Date End Date Taking? Authorizing Provider  albuterol (PROVENTIL HFA;VENTOLIN HFA) 108 (90 Base) MCG/ACT inhaler Inhale 2 puffs into the lungs every 6 (six) hours as needed. 11/21/16   Sherwood Gambler, MD  escitalopram (LEXAPRO) 10 MG tablet Take 1 tablet (10 mg total) by mouth daily. 02/22/22 04/23/22  Eugenia Pancoast, FNP  hydrOXYzine (VISTARIL) 50 MG capsule Take 1 capsule (50 mg total) by mouth 3 (three) times daily as needed. 02/22/22   Eugenia Pancoast, FNP  omeprazole (PRILOSEC) 20 MG capsule Take 1 capsule (20 mg total) by mouth daily. 02/22/22   Eugenia Pancoast, FNP  WHEY PROTEIN CONCENTRATE PO Take by mouth.    [provider]    Family History Family History  Problem Relation Age of Onset   Hypertension Mother    Atrial fibrillation Father    Multiple sclerosis Paternal Grandmother    Social History Social History   Tobacco Use   Smoking status: Never   Smokeless tobacco: Never  Vaping Use   Vaping Use: Never used  Substance Use Topics   Alcohol use: No   Drug use: No   Allergies   Patient has no known allergies.  Review of Systems Review of Systems Pertinent findings revealed after performing a 14 point review of systems has been noted in the history of present illness.  Physical Exam Triage Vital Signs ED Triage Vitals  Enc Vitals Group     BP 06/05/21 0827 (!) 147/82     Pulse Rate 06/05/21 0827 72     Resp 06/05/21 0827 18     Temp 06/05/21 0827 98.3 F (36.8 C)     Temp Source 06/05/21 0827 Oral     SpO2 06/05/21 0827 98 %     Weight --      Height --      Head Circumference --      Peak Flow --  Pain Score 06/05/21 0826 5     Pain Loc --      Pain Edu? --      Excl. in GC? --   No data found.  Updated Vital Signs BP 112/71   Pulse 78   Temp 98.6 F (37 C)   Resp 20   SpO2 98%   Physical Exam Vitals and nursing note reviewed.  Constitutional:      General: He is not in acute distress.    Appearance: Normal appearance. He is not ill-appearing.  HENT:     Head: Normocephalic and atraumatic.     Salivary Glands: Right salivary gland is not diffusely enlarged or tender. Left salivary gland is not diffusely enlarged or tender.     Right Ear: Ear canal and external ear normal. No drainage. A middle ear effusion is present. There is no impacted cerumen. Tympanic membrane is bulging. Tympanic membrane is not injected or erythematous.     Left Ear: Ear canal and external ear  normal. No drainage. A middle ear effusion is present. There is no impacted cerumen. Tympanic membrane is bulging. Tympanic membrane is not injected or erythematous.     Ears:     Comments: Bilateral EACs normal, both TMs bulging with clear fluid    Nose: Rhinorrhea present. No nasal deformity, septal deviation, signs of injury, nasal tenderness, mucosal edema or congestion. Rhinorrhea is clear.     Right Nostril: Occlusion present. No foreign body, epistaxis or septal hematoma.     Left Nostril: Occlusion present. No foreign body, epistaxis or septal hematoma.     Right Turbinates: Enlarged, swollen and pale.     Left Turbinates: Enlarged, swollen and pale.     Right Sinus: No maxillary sinus tenderness or frontal sinus tenderness.     Left Sinus: No maxillary sinus tenderness or frontal sinus tenderness.     Mouth/Throat:     Lips: Pink. No lesions.     Mouth: Mucous membranes are moist. No oral lesions.     Pharynx: Oropharynx is clear. Uvula midline. No posterior oropharyngeal erythema or uvula swelling.     Tonsils: No tonsillar exudate. 0 on the right. 0 on the left.     Comments: Postnasal drip Eyes:     General: Lids are normal.        Right eye: No discharge.        Left eye: No discharge.     Extraocular Movements: Extraocular movements intact.     Conjunctiva/sclera: Conjunctivae normal.     Right eye: Right conjunctiva is not injected.     Left eye: Left conjunctiva is not injected.  Neck:     Trachea: Trachea and phonation normal.  Cardiovascular:     Rate and Rhythm: Normal rate and regular rhythm.     Pulses: Normal pulses.     Heart sounds: Normal heart sounds. No murmur heard.    No friction rub. No gallop.  Pulmonary:     Effort: Pulmonary effort is normal. No accessory muscle usage, prolonged expiration or respiratory distress.     Breath sounds: Normal breath sounds. No stridor, decreased air movement or transmitted upper airway sounds. No decreased breath sounds,  wheezing, rhonchi or rales.  Chest:     Chest wall: No tenderness.  Musculoskeletal:        General: Normal range of motion.     Cervical back: Normal range of motion and neck supple. Normal range of motion.  Lymphadenopathy:  Cervical: No cervical adenopathy.  Skin:    General: Skin is warm and dry.     Findings: No erythema or rash.  Neurological:     General: No focal deficit present.     Mental Status: He is alert and oriented to person, place, and time.  Psychiatric:        Mood and Affect: Mood normal.        Behavior: Behavior normal.     Visual Acuity Right Eye Distance:   Left Eye Distance:   Bilateral Distance:    Right Eye Near:   Left Eye Near:    Bilateral Near:     UC Couse / Diagnostics / Procedures:     Radiology No results found.  Procedures Procedures (including critical care time) EKG  Pending results:  Labs Reviewed - No data to display  Medications Ordered in UC: Medications - No data to display  UC Diagnoses / Final Clinical Impressions(s)   I have reviewed the triage vital signs and the nursing notes.  Pertinent labs & imaging results that were available during my care of the patient were reviewed by me and considered in my medical decision making (see chart for details).    Final diagnoses:  Diarrhea, unspecified type  Allergic rhinitis with postnasal drip  Rhinosinusitis  Anxiety about health   Patient provided with Imodium for intermittent episodes of diarrhea.  Patient provided with Zyrtec and Flonase for allergy symptoms.  Patient advised to follow-up with his primary care provider regarding his anxiety.  ED Prescriptions     Medication Sig Dispense Auth. Provider   loperamide (IMODIUM A-D) 2 MG tablet Take 1 tablet (2 mg total) by mouth 4 (four) times daily as needed for diarrhea or loose stools. 30 tablet Theadora Rama Scales, PA-C   cetirizine (ZYRTEC ALLERGY) 10 MG tablet Take 1 tablet (10 mg total) by mouth at  bedtime. 90 tablet Theadora Rama Scales, PA-C   fluticasone (FLONASE) 50 MCG/ACT nasal spray Place 1 spray into both nostrils daily. 47.4 mL Theadora Rama Scales, PA-C      PDMP not reviewed this encounter.  Disposition Upon Discharge:  Condition: stable for discharge home Home: take medications as prescribed; routine discharge instructions as discussed; follow up as advised.  Patient presented with an acute illness with associated systemic symptoms and significant discomfort requiring urgent management. In my opinion, this is a condition that a prudent lay person (someone who possesses an average knowledge of health and medicine) may potentially expect to result in complications if not addressed urgently such as respiratory distress, impairment of bodily function or dysfunction of bodily organs.   Routine symptom specific, illness specific and/or disease specific instructions were discussed with the patient and/or caregiver at length.   As such, the patient has been evaluated and assessed, work-up was performed and treatment was provided in alignment with urgent care protocols and evidence based medicine.  Patient/parent/caregiver has been advised that the patient may require follow up for further testing and treatment if the symptoms continue in spite of treatment, as clinically indicated and appropriate.  If the patient was tested for COVID-19, Influenza and/or RSV, then the patient/parent/guardian was advised to isolate at home pending the results of his/her diagnostic coronavirus test and potentially longer if they're positive. I have also advised pt that if his/her COVID-19 test returns positive, it's recommended to self-isolate for at least 10 days after symptoms first appeared AND until fever-free for 24 hours without fever reducer AND other symptoms have  improved or resolved. Discussed self-isolation recommendations as well as instructions for household member/close contacts as per the  [facility], and also gave patient the [place] packet with this information.  Patient/parent/caregiver has been advised to return to the [facility] or PCP in 3-5 days if no better; to PCP or the Emergency Department if new signs and symptoms develop, or if the current signs or symptoms continue to change or worsen for further workup, evaluation and treatment as clinically indicated and appropriate  The patient will follow up with their current PCP if and as advised. If the patient does not currently have a PCP we will assist them in obtaining one.   The patient may need specialty follow up if the symptoms continue, in spite of conservative treatment and management, for further workup, evaluation, consultation and treatment as clinically indicated and appropriate.  Patient/parent/caregiver verbalized understanding and agreement of plan as discussed.  All questions were addressed during visit.  Please see discharge instructions below for further details of plan.  Discharge Instructions:   Discharge Instructions      For episodes of diarrhea, please take 1 tablet of Imodium with each loose bowel movement, do not take more than 4 tablets/day.  Imodium slows down the muscles in your bowels so that you still stays your body longer, with more water is absorbed back into the body and you are still comes out more solid.  For your upper respiratory allergies, please begin Zyrtec and Flonase.  Zyrtec (cetirizine): This is an excellent second-generation antihistamine that helps to reduce respiratory inflammatory response to environmental allergens.  In some patients, this medication can cause daytime sleepiness so I recommend that you give your child this medication at bedtime every day.     Flonase (fluticasone): This is a steroid nasal spray that you use once daily, 1 spray in each nare.  This medication does not work well if you decide to use it only used as you feel you need to, it works best used on a  daily basis.  After 3 to 5 days of use, you will notice significant reduction of the inflammation and mucus production that is currently being caused by exposure to allergens, whether seasonal or environmental.  The most common side effect of this medication is nosebleeds.  If you experience a nosebleed, please discontinue use for 1 week, then feel free to resume.  I have provided you with a prescription.     If your insurance will not cover your allergy medications, please consider downloading the Good Rx app which is free.  You can find considerable discounts on prescription and over-the-counter medications.   If you find that you have not had improvement of your symptoms in the next 5 to 7 days, please follow-up with your primary care provider or return here to urgent care for repeat evaluation and further recommendations.   Thank you for visiting urgent care today.  We appreciate the opportunity to participate in your care.       This office note has been dictated using Museum/gallery curator.  Unfortunately, this method of dictation can sometimes lead to typographical or grammatical errors.  I apologize for your inconvenience in advance if this occurs.  Please do not hesitate to reach out to me if clarification is needed.      Lynden Oxford Scales, PA-C 02/25/22 1250

## 2022-02-25 NOTE — Discharge Instructions (Signed)
For episodes of diarrhea, please take 1 tablet of Imodium with each loose bowel movement, do not take more than 4 tablets/day.  Imodium slows down the muscles in your bowels so that you still stays your body longer, with more water is absorbed back into the body and you are still comes out more solid.  For your upper respiratory allergies, please begin Zyrtec and Flonase.  Zyrtec (cetirizine): This is an excellent second-generation antihistamine that helps to reduce respiratory inflammatory response to environmental allergens.  In some patients, this medication can cause daytime sleepiness so I recommend that you give your child this medication at bedtime every day.     Flonase (fluticasone): This is a steroid nasal spray that you use once daily, 1 spray in each nare.  This medication does not work well if you decide to use it only used as you feel you need to, it works best used on a daily basis.  After 3 to 5 days of use, you will notice significant reduction of the inflammation and mucus production that is currently being caused by exposure to allergens, whether seasonal or environmental.  The most common side effect of this medication is nosebleeds.  If you experience a nosebleed, please discontinue use for 1 week, then feel free to resume.  I have provided you with a prescription.     If your insurance will not cover your allergy medications, please consider downloading the Good Rx app which is free.  You can find considerable discounts on prescription and over-the-counter medications.   If you find that you have not had improvement of your symptoms in the next 5 to 7 days, please follow-up with your primary care provider or return here to urgent care for repeat evaluation and further recommendations.   Thank you for visiting urgent care today.  We appreciate the opportunity to participate in your care.

## 2022-03-24 ENCOUNTER — Ambulatory Visit (INDEPENDENT_AMBULATORY_CARE_PROVIDER_SITE_OTHER): Payer: BC Managed Care – PPO | Admitting: Interventional Cardiology

## 2022-03-24 ENCOUNTER — Encounter: Payer: Self-pay | Admitting: Interventional Cardiology

## 2022-03-24 VITALS — BP 96/56 | HR 53 | Ht 74.0 in | Wt 154.2 lb

## 2022-03-24 DIAGNOSIS — R001 Bradycardia, unspecified: Secondary | ICD-10-CM

## 2022-03-24 DIAGNOSIS — F411 Generalized anxiety disorder: Secondary | ICD-10-CM

## 2022-03-24 NOTE — Progress Notes (Signed)
Cardiology Office Note:    Date:  03/24/2022   ID:  Guy Campbell, DOB Apr 19, 1996, MRN [id number]  PCP:  Mort Sawyers, FNP  Cardiologist:  None   Referring MD: Mort Sawyers, FNP   Chief Complaint  Patient presents with   Advice Only    Bradycardia    History of Present Illness:    Guy Campbell is a 26 y.o. male with a hx of palpitations and bradycardia referred for cardiac evaluation.  He is referred from the emergency room because of heart rate of 55.  EKG demonstrated sinus bradycardia but was otherwise unremarkable.  Prominent voltage noted on the EKG related to very thin morphotypes.  He went to the emergency room with an assortment of complaints that included headache neck stiffness difficulty with pain radiating down the left side of his body and into the abdomen.  He felt out of energy.  No acute abnormalities were found during the hospital stay.  He was placed on some antibiotics and states that he gradually started feeling better.  He feels back to normal now.  He is a former Building services engineer.  Not in as good a shape as prior.  Played basketball this past weekend with his cousins while at the beach.  Played in the hot sun and had no trouble what ever.  He denies orthopnea, PND, palpitations, edema, syncope, exertional related chest pain.  He has a history of bradycardia and points out that anesthesia at [facility] mention that his heart rate was slow prior to the procedure was being done for dental work.  This was when he was still in high school.  Past Medical History:  Diagnosis Date   Asthma    Palpitations     Past Surgical History:  Procedure Laterality Date   NO PAST SURGERIES      Current Medications: Current Meds  Medication Sig   fluticasone (FLONASE) 50 MCG/ACT nasal spray Place 1 spray into both nostrils daily. (Patient taking differently: Place 1 spray into both nostrils as needed.)   WHEY PROTEIN CONCENTRATE PO Take by mouth as needed.      Allergies:   Patient has no known allergies.   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  Tobacco Use   Smoking status: Never   Smokeless tobacco: Never  Vaping Use   Vaping Use: Never used  Substance and Sexual Activity   Alcohol use: No   Drug use: No   Sexual activity: Yes    Partners: Female    Birth control/protection: Condom  Other Topics Concern   Not on file  Social History Narrative   Not on file   Social Determinants of Health   Financial Resource Strain: Not on file  Food Insecurity: Not on file  Transportation Needs: Not on file  Physical Activity: Not on file  Stress: Not on file  Social Connections: Not on file     Family History: The patient's family history includes Atrial fibrillation in his father; Hypertension in his mother; Multiple sclerosis in his paternal grandmother.  ROS:   Please see the history of present illness.    Healthy.  No difficulty with eating.  Has some level of anxiety.  Difficulty sleeping.  All other systems reviewed and are negative.  EKGs/Labs/Other Studies Reviewed:    The following studies were reviewed today: No cardiac evaluation or other imaging  EKG:  EKG performed 02/22/2022 demonstrates sinus bradycardia 55 bpm, prominent voltage, and otherwise normal tracing.  Recent Labs: 02/22/2022: ALT 14; BUN 12; Creatinine, Ser 1.23; Hemoglobin 16.1; Platelets 217.0; Potassium 4.2; Sodium 138; TSH 0.88  Recent Lipid Panel    Component Value Date/Time   CHOL 203 (H) 02/22/2022 1627   TRIG 53.0 02/22/2022 1627   HDL 69.70 02/22/2022 1627   CHOLHDL 3 02/22/2022 1627   VLDL 10.6 02/22/2022 1627   LDLCALC 123 (H) 02/22/2022 1627    Physical Exam:    VS:  BP (!)  96/56   Pulse (!) 53   Ht 6\' 2"  (1.88 m)   Wt 154 lb 3.2 oz (69.9 kg)   SpO2 98%   BMI 19.80 kg/m     Wt Readings from Last 3 Encounters:  03/24/22 154 lb 3.2 oz (69.9 kg)  02/22/22 154 lb 8 oz (70.1 kg)  11/20/16 151 lb 9.6 oz (68.8 kg)     GEN: Slender, healthy in appearance. No acute distress HEENT: Normal NECK: No JVD. LYMPHATICS: No lymphadenopathy CARDIAC: No murmur. RRR no gallop, or edema. VASCULAR:  Normal Pulses. No bruits. RESPIRATORY:  Clear to auscultation without rales, wheezing or rhonchi  ABDOMEN: Soft, non-tender, non-distended, No pulsatile mass, MUSCULOSKELETAL: No deformity  SKIN: Warm and dry NEUROLOGIC:  Alert and oriented x 3 PSYCHIATRIC:  Normal affect   ASSESSMENT:    1. Bradycardia   2. Anxiety state    PLAN:    In order of problems listed above:  History of slow heart rate according to the patient.  His heart rate increases quickly into the normal range with standing and with lying flat.  Heart rate on EKG was 55 bpm and in office today 53 bpm.  We did not repeat the EKG.  Heart rate increases appropriately with walking.  Heart rate is relatively slow likely because of conditioning.  No symptoms of cerebral hypoperfusion.  In absence of any auscultatory abnormality, no cardiac evaluation is felt indicated at this time.   Medication Adjustments/Labs and Tests Ordered: Current medicines are reviewed at length with the patient today.  Concerns regarding medicines are outlined above.  No orders of the defined types were placed in this encounter.  No orders of the defined types were placed in this encounter.   Patient Instructions  Medication Instructions:  Your physician recommends that you continue on your current medications as directed. Please refer to the Current Medication list given to you today.  *If you need a refill on your cardiac medications before your next appointment, please call your pharmacy*  Lab  Work: NONE  Testing/Procedures: NONE  Important Information About Sugar         Signed, 11/22/16, MD  03/24/2022 4:31 PM    [facility]

## 2022-03-24 NOTE — Patient Instructions (Signed)
Medication Instructions:  Your physician recommends that you continue on your current medications as directed. Please refer to the Current Medication list given to you today.  *If you need a refill on your cardiac medications before your next appointment, please call your pharmacy*  Lab Work: NONE  Testing/Procedures: NONE   Important Information About Sugar       

## 2022-03-26 ENCOUNTER — Encounter: Payer: Self-pay | Admitting: Family

## 2022-03-26 ENCOUNTER — Ambulatory Visit: Payer: BC Managed Care – PPO | Admitting: Family

## 2022-03-26 VITALS — BP 108/76 | HR 55 | Temp 98.6°F | Resp 16 | Ht 74.0 in | Wt 156.1 lb

## 2022-03-26 DIAGNOSIS — D72819 Decreased white blood cell count, unspecified: Secondary | ICD-10-CM

## 2022-03-26 DIAGNOSIS — E7800 Pure hypercholesterolemia, unspecified: Secondary | ICD-10-CM

## 2022-03-26 DIAGNOSIS — F411 Generalized anxiety disorder: Secondary | ICD-10-CM | POA: Diagnosis not present

## 2022-03-26 DIAGNOSIS — H66002 Acute suppurative otitis media without spontaneous rupture of ear drum, left ear: Secondary | ICD-10-CM

## 2022-03-26 DIAGNOSIS — R001 Bradycardia, unspecified: Secondary | ICD-10-CM

## 2022-03-26 LAB — CBC WITH DIFFERENTIAL/PLATELET
Basophils Absolute: 22 cells/uL (ref 0–200)
Eosinophils Absolute: 31 cells/uL (ref 15–500)
HCT: 45 % (ref 38.5–50.0)
Hemoglobin: 15.7 g/dL (ref 13.2–17.1)
MCH: 32.4 pg (ref 27.0–33.0)
MCHC: 34.9 g/dL (ref 32.0–36.0)
MCV: 92.8 fL (ref 80.0–100.0)
Neutro Abs: 2455 cells/uL (ref 1500–7800)
Platelets: 209 10*3/uL (ref 140–400)
RDW: 12.1 % (ref 11.0–15.0)
WBC: 4.4 10*3/uL (ref 3.8–10.8)

## 2022-03-26 MED ORDER — AMOXICILLIN-POT CLAVULANATE 875-125 MG PO TABS: 1.0000 | ORAL_TABLET | Freq: Two times a day (BID) | ORAL | 0 refills | Status: DC

## 2022-03-26 MED ORDER — FLUTICASONE PROPIONATE 50 MCG/ACT NA SUSP: 1.0000 | Freq: Every day | NASAL | 1 refills | Status: AC

## 2022-03-26 NOTE — Assessment & Plan Note (Signed)
Repeat fasting lipid panel pending results Work on low chol diet exercise as tolerated

## 2022-03-26 NOTE — Assessment & Plan Note (Signed)
Repeat cbc today Suspected ethnic variant

## 2022-03-26 NOTE — Patient Instructions (Addendum)
Hydroxyxine is only as needed. Take this if any increased anxiety or panic type states.   Repeat cbc (lab work today)  Due to recent changes in healthcare laws, you may see results of your imaging and/or laboratory studies on MyChart before I have had a chance to review them.  I understand that in some cases there may be results that are confusing or concerning to you. Please understand that not all results are received at the same time and often I may need to interpret multiple results in order to provide you with the best plan of care or course of treatment. Therefore, I ask that you please give me 2 business days to thoroughly review all your results before contacting my office for clarification. Should we see a critical lab result, you will be contacted sooner.   It was a pleasure seeing you today! Please do not hesitate to reach out with any questions and or concerns.  Regards,   Mort Sawyers FNP-C

## 2022-03-26 NOTE — Assessment & Plan Note (Signed)
Continue f/u with cardiologist prn

## 2022-03-26 NOTE — Assessment & Plan Note (Signed)
rx augmentin 875/125 mg po bid x 10 days Take antibiotic as prescribed. Increase oral fluids. Pt to f/u if sx worsen and or fail to improve in 2-3 days.  

## 2022-03-26 NOTE — Assessment & Plan Note (Signed)
hydroxyxine prn  Work on anxiety reducing techniques

## 2022-03-26 NOTE — Progress Notes (Signed)
Established Patient Office Visit  Subjective:  Patient ID: Guy Campbell, male    DOB: [birth year]  Age: 26 y.o. MRN: [id number]  CC:  Chief Complaint  Patient presents with   Asthma    HPI Guy Campbell is here today for follow up.   Bradycardia: seen with cardiologist , no abnormal workup. Told to f/u prn. Likely due to athletic state.   Anxiety state: took 1/2 pill first day of lexapro but stopped thought was supposed to only take as needed. He states anxiety much better since last visit however he was worried about his health state with sinusitis.   Allergic rhinitis: taking daily flonase which has helped a lot  Does still feel ears popping especially on left side. 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  Tobacco Use   Smoking status: Never   Smokeless tobacco: Never  Vaping Use   Vaping Use: Never used  Substance and Sexual Activity   Alcohol use: No   Drug use: No   Sexual activity: Yes    Partners: Female    Birth control/protection: Condom  Other Topics Concern   Not on file  Social History Narrative   Not on file   Social Determinants of Health   Financial Resource Strain: Not on file  Food Insecurity: Not on file  Transportation Needs: Not on file  Physical Activity: Not on file  Stress: Not on file  Social Connections: Not on file  Intimate Partner Violence: Not  on file    Outpatient Medications Prior to Visit  Medication Sig Dispense Refill   fluticasone (FLONASE) 50 MCG/ACT nasal spray Place 1 spray into both nostrils daily. (Patient taking differently: Place 1 spray into both nostrils as needed.) 47.4 mL 1   WHEY PROTEIN CONCENTRATE PO Take by mouth as needed.     No facility-administered medications prior to visit.    No Known Allergies    Review of Systems  Respiratory:  Negative for shortness of breath.   Cardiovascular:  Negative for chest pain and palpitations.  Gastrointestinal:  Negative for constipation and diarrhea.  Genitourinary:  Negative for dysuria, frequency and urgency.  Musculoskeletal:  Negative for myalgias.  Psychiatric/Behavioral:  Negative for depression and suicidal ideas.   All other systems reviewed and are negative.    Objective:    Physical Exam Constitutional:      General: He is awake. He is not in acute distress.    Appearance: Normal appearance. He is not ill-appearing.  HENT:     Right Ear: Hearing, tympanic membrane and external ear normal.     Left Ear: Hearing and external ear normal. A middle ear effusion is present. Tympanic membrane is erythematous.     Nose: Nose normal.  Right Turbinates: Not enlarged or swollen.     Left Turbinates: Not enlarged or swollen.     Right Sinus: No maxillary sinus tenderness or frontal sinus tenderness.     Left Sinus: No maxillary sinus tenderness or frontal sinus tenderness.     Mouth/Throat:     Mouth: Mucous membranes are moist.     Pharynx: No pharyngeal swelling, oropharyngeal exudate or posterior oropharyngeal erythema.  Eyes:     Extraocular Movements: Extraocular movements intact.     Pupils: Pupils are equal, round, and reactive to light.  Cardiovascular:     Rate and Rhythm: Normal rate and regular rhythm.  Pulmonary:     Effort: Pulmonary effort is normal.     Breath sounds: Normal breath sounds. No wheezing.  Neurological:     Mental  Status: He is alert.     BP 108/76   Pulse (!) 55   Temp 98.6 F (37 C)   Resp 16   Ht 6\' 2"  (1.88 m)   Wt 156 lb 2 oz (70.8 kg)   SpO2 98%   BMI 20.05 kg/m  Wt Readings from Last 3 Encounters:  03/26/22 156 lb 2 oz (70.8 kg)  03/24/22 154 lb 3.2 oz (69.9 kg)  02/22/22 154 lb 8 oz (70.1 kg)     Health Maintenance Due  Topic Date Due   HPV VACCINES (1 - Male 2-dose series) Never done   HIV Screening  Never done   Hepatitis C Screening  Never done   TETANUS/TDAP  Never done   INFLUENZA VACCINE  03/09/2022       Topic Date Due   HPV VACCINES (1 - Male 2-dose series) Never done    Lab Results  Component Value Date   TSH 0.88 02/22/2022   Lab Results  Component Value Date   WBC 3.9 (L) 02/22/2022   HGB 16.1 02/22/2022   HCT 48.0 02/22/2022   MCV 96.9 02/22/2022   PLT 217.0 02/22/2022   Lab Results  Component Value Date   NA 138 02/22/2022   K 4.2 02/22/2022   CO2 29 02/22/2022   GLUCOSE 82 02/22/2022   BUN 12 02/22/2022   CREATININE 1.23 02/22/2022   BILITOT 0.5 02/22/2022   ALKPHOS 48 02/22/2022   AST 18 02/22/2022   ALT 14 02/22/2022   PROT 7.9 02/22/2022   ALBUMIN 5.2 02/22/2022   CALCIUM 10.1 02/22/2022   ANIONGAP 10 11/21/2016   GFR 81.05 02/22/2022   Lab Results  Component Value Date   CHOL 203 (H) 02/22/2022   Lab Results  Component Value Date   HDL 69.70 02/22/2022   Lab Results  Component Value Date   LDLCALC 123 (H) 02/22/2022   Lab Results  Component Value Date   TRIG 53.0 02/22/2022   Lab Results  Component Value Date   CHOLHDL 3 02/22/2022   No results found for: "HGBA1C"    Assessment & Plan:   Problem List Items Addressed This Visit       Nervous and Auditory   Non-recurrent acute suppurative otitis media of left ear without spontaneous rupture of tympanic membrane    rx augmentin 875/125 mg po bid x 10 days  Take antibiotic as prescribed. Increase oral fluids. Pt to f/u if sx worsen and or fail to improve in  2-3 days.       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    Dispense:  47.4 mL    Refill:  1    Order Specific Question:   Supervising Provider    Answer:   BEDSOLE, AMY E [[id number]]   amoxicillin-clavulanate (AUGMENTIN) 875-125 MG tablet    Sig: Take 1 tablet by mouth 2 (two) times daily.    Dispense:  20 tablet    Refill:  0    Order Specific Question:   Supervising Provider    Answer:   Ermalene Searing, AMY E [[id number]]    Follow-up: Return in about 1 year (around 03/27/2023) for annually for physical and or as needed .    Mort Sawyers, FNP

## 2022-03-27 LAB — CBC WITH DIFFERENTIAL/PLATELET
Absolute Monocytes: 330 cells/uL (ref 200–950)
Basophils Relative: 0.5 %
Eosinophils Relative: 0.7 %
Lymphs Abs: 1562 cells/uL (ref 850–3900)
MPV: 10.1 fL (ref 7.5–12.5)
Monocytes Relative: 7.5 %
Neutrophils Relative %: 55.8 %
RBC: 4.85 10*6/uL (ref 4.20–5.80)
Total Lymphocyte: 35.5 %

## 2022-03-27 LAB — LIPID PANEL
Cholesterol: 173 mg/dL (ref <200)
HDL: 65 mg/dL (ref >=40)
LDL Cholesterol (Calc): 96 mg/dL (calc)
Non-HDL Cholesterol (Calc): 108 mg/dL (calc) (ref <130)
Total CHOL/HDL Ratio: 2.7 (calc) (ref <5.0)
Triglycerides: 41 mg/dL (ref <150)

## 2022-04-08 ENCOUNTER — Ambulatory Visit: Payer: BC Managed Care – PPO | Admitting: Family

## 2022-04-22 ENCOUNTER — Ambulatory Visit: Payer: BC Managed Care – PPO | Admitting: Family

## 2022-04-22 ENCOUNTER — Encounter: Payer: Self-pay | Admitting: Family

## 2022-04-22 VITALS — BP 110/62 | HR 62 | Temp 97.6°F | Ht 74.0 in | Wt 157.0 lb

## 2022-04-22 DIAGNOSIS — F411 Generalized anxiety disorder: Secondary | ICD-10-CM

## 2022-04-22 DIAGNOSIS — J3489 Other specified disorders of nose and nasal sinuses: Secondary | ICD-10-CM | POA: Diagnosis not present

## 2022-04-22 DIAGNOSIS — J302 Other seasonal allergic rhinitis: Secondary | ICD-10-CM | POA: Diagnosis not present

## 2022-04-22 MED ORDER — PREDNISONE 20 MG PO TABS: ORAL_TABLET | ORAL | 0 refills | Status: DC

## 2022-04-22 NOTE — Assessment & Plan Note (Addendum)
Stable Continue hydroxyxine 25 mg prn anxiety

## 2022-04-22 NOTE — Progress Notes (Signed)
Established Patient Office Visit  Subjective:  Patient ID: Guy Campbell, male    DOB: 1996/03/29  Age: 26 y.o. MRN: [id number]  CC:  Chief Complaint  Patient presents with   Ear Fullness    Was treated for ear infection still having some fullness in left ear. Took last antibiotic on 9/8.     HPI Guy Campbell is here today with concerns.   AOM treated last visit 8/18, just completed augmentin one week ago. He is unable to tell me if he was taking twice daily or not. He does still c/o left sided ear fullness and popping here and there. Some left sided sinus pressure, with some nasal congestion and runny nose. Taking flonase daily.   No fever or chills.  No cough.   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  Tobacco Use   Smoking status: Never   Smokeless tobacco: Never  Vaping Use   Vaping Use: Never used  Substance and Sexual Activity   Alcohol use: No   Drug use: No   Sexual activity: Yes    Partners: Female    Birth control/protection: Condom  Other Topics Concern   Not on file  Social History Narrative   Not on file   Social Determinants of Health   Financial Resource Strain: Not on file  Food Insecurity: Not on file  Transportation Needs: Not on file  Physical Activity: Not on file  Stress: Not on file  Social Connections: Not on file  Intimate Partner Violence: Not on file    Outpatient  Medications Prior to Visit  Medication Sig Dispense Refill   fluticasone (FLONASE) 50 MCG/ACT nasal spray Place 1 spray into both nostrils daily. 47.4 mL 1   hydrOXYzine (ATARAX) 25 MG tablet Take 25 mg by mouth 3 (three) times daily as needed.     amoxicillin-clavulanate (AUGMENTIN) 875-125 MG tablet Take 1 tablet by mouth 2 (two) times daily. 20 tablet 0   No facility-administered medications prior to visit.    No Known Allergies      Objective:    Physical Exam Constitutional:      General: He is awake. He is not in acute distress.    Appearance: Normal appearance. He is not ill-appearing.  HENT:     Right Ear: Hearing, tympanic membrane, ear canal and external ear normal.     Left Ear: Hearing, tympanic membrane, ear canal and external ear normal.     Ears:     Comments: Scant ear wax right ear     Nose: Nasal tenderness (right ethmoid tenderness on palpation), mucosal edema and congestion present.     Right Turbinates: Enlarged and swollen.     Left Turbinates: Not enlarged or swollen.     Right Sinus: No maxillary sinus tenderness or  frontal sinus tenderness.     Left Sinus: No maxillary sinus tenderness or frontal sinus tenderness.     Mouth/Throat:     Mouth: Mucous membranes are moist.     Pharynx: Posterior oropharyngeal erythema (cobblestone) present. No pharyngeal swelling or oropharyngeal exudate.  Eyes:     Extraocular Movements: Extraocular movements intact.     Pupils: Pupils are equal, round, and reactive to light.  Cardiovascular:     Rate and Rhythm: Normal rate and regular rhythm.  Pulmonary:     Effort: Pulmonary effort is normal.     Breath sounds: Normal breath sounds. No wheezing.  Neurological:     Mental Status: He is alert.       BP 110/62   Pulse 62   Temp 97.6 F (36.4 C) (Oral)   Ht 6\' 2"  (1.88 m)   Wt 157 lb (71.2 kg)   SpO2 98%   BMI 20.16 kg/m  Wt Readings from Last 3 Encounters:  04/22/22 157 lb (71.2 kg)  03/26/22 156 lb 2  oz (70.8 kg)  03/24/22 154 lb 3.2 oz (69.9 kg)     Health Maintenance Due  Topic Date Due   HPV VACCINES (1 - Male 2-dose series) Never done   HIV Screening  Never done   Hepatitis C Screening  Never done   TETANUS/TDAP  Never done       Topic Date Due   HPV VACCINES (1 - Male 2-dose series) Never done    Lab Results  Component Value Date   TSH 0.88 02/22/2022   Lab Results  Component Value Date   WBC 4.4 03/26/2022   HGB 15.7 03/26/2022   HCT 45.0 03/26/2022   MCV 92.8 03/26/2022   PLT 209 03/26/2022   Lab Results  Component Value Date   NA 138 02/22/2022   K 4.2 02/22/2022   CO2 29 02/22/2022   GLUCOSE 82 02/22/2022   BUN 12 02/22/2022   CREATININE 1.23 02/22/2022   BILITOT 0.5 02/22/2022   ALKPHOS 48 02/22/2022   AST 18 02/22/2022   ALT 14 02/22/2022   PROT 7.9 02/22/2022   ALBUMIN 5.2 02/22/2022   CALCIUM 10.1 02/22/2022   ANIONGAP 10 11/21/2016   GFR 81.05 02/22/2022   Lab Results  Component Value Date   CHOL 173 03/26/2022   Lab Results  Component Value Date   HDL 65 03/26/2022   Lab Results  Component Value Date   LDLCALC 96 03/26/2022   Lab Results  Component Value Date   TRIG 41 03/26/2022   Lab Results  Component Value Date   CHOLHDL 2.7 03/26/2022   No results found for: "HGBA1C"    Assessment & Plan:   Problem List Items Addressed This Visit       Respiratory   Seasonal allergic rhinitis - Primary    Pt instructed to start Flonase daily and OTC Cetrizine before bedtime.  Advised to return to office symptoms worsen.       Relevant Medications   predniSONE (DELTASONE) 20 MG tablet     Other   Anxiety state    Stable Continue hydroxyxine 25 mg prn anxiety       Relevant Medications   hydrOXYzine (ATARAX) 25 MG tablet   Sinus pressure    rx prednisone 40 mg once daily for five days   I evaluated the patient,  was consulted regarding plans for treatment of care, and agree with the assessment and plan per 03/28/2022, RN, DNP student.  Mort Sawyers, FNP-C        Meds ordered this encounter  Medications   predniSONE (DELTASONE) 20 MG tablet    Sig: Take two tablets po qd for five days    Dispense:  10 tablet    Refill:  0    Order Specific Question:   Supervising Provider    Answer:   Ermalene Searing, AMY E [[id number]]    Follow-up: Return if symptoms worsen or fail to improve.    Mort Sawyers, FNP

## 2022-04-22 NOTE — Patient Instructions (Addendum)
Start zyrtec (Cetirizine) at night. Budget-Friendly Healthy Eating Start daily Flonase.   Sending in prednisone to help you with your sinus pressure.    Regards,   Mort Sawyers FNP-C

## 2022-04-22 NOTE — Assessment & Plan Note (Signed)
Resolved. Still having ear fullness. Suspect allergic rhinitis. Will send in prednisone 20mg  two tablets daily for five days for relief.

## 2022-04-22 NOTE — Assessment & Plan Note (Addendum)
rx prednisone 40 mg once daily for five days   I evaluated the patient,  was consulted regarding plans for treatment of care, and agree with the assessment and plan per Modesto Charon, RN, DNP student.   Mort Sawyers, FNP-C

## 2022-04-22 NOTE — Assessment & Plan Note (Addendum)
Pt instructed to start Flonase daily and OTC Cetrizine before bedtime.  Advised to return to office symptoms worsen.

## 2022-04-22 NOTE — Progress Notes (Signed)
Established Patient Office Visit  Subjective   Patient ID: Guy Campbell, male    DOB: July 14, 1996  Age: 26 y.o. MRN: [id number]  Chief Complaint  Patient presents with   Ear Fullness    Was treated for ear infection still having some fullness in left ear. Took last antibiotic on 9/8.     HPI  Guy Campbell is  a 26 year old male presents today for an acute visit.    He was treated for acute otitis media of the left ear on 03/26/22. He states that he had relief but started getting worse three days ago but reports yesterday and today is a little better. Denies any pain or discharge. Has some sinus pain on left side. He took his last dose of Augmentin on Tuesday. Reports nausea, fatigue that evening, which led to calling out of work. The next day he was feeling much better. No cough or congestion. He does have post nasal drainage and has been using Flonase as needed with minimal relief. He has not been around anyone who has been sick that he knows of.    No fever. No nausea today. No body aches.    Patient Active Problem List   Diagnosis Date Noted   Seasonal allergic rhinitis 04/22/2022   Sinus pressure 04/22/2022   Mild intermittent asthma without complication 02/22/2022   Bradycardia 02/22/2022   Anxiety state 02/22/2022   Past Medical History:  Diagnosis Date   Asthma    Palpitations    Past Surgical History:  Procedure Laterality Date   NO PAST SURGERIES     Social History   Tobacco Use   Smoking status: Never   Smokeless tobacco: Never  Vaping Use   Vaping Use: Never used  Substance Use Topics   Alcohol use: No   Drug use: No   Family History  Problem Relation Age of Onset   Hypertension Mother    Atrial fibrillation Father    Multiple sclerosis Paternal Grandmother    No Known Allergies       Objective:     BP 110/62   Pulse 62   Temp 97.6 F (36.4 C) (Oral)   Ht 6\' 2"  (1.88 m)   Wt 157 lb (71.2 kg)   SpO2 98%   BMI 20.16 kg/m  BP  Readings from Last 3 Encounters:  04/22/22 110/62  03/26/22 108/76  03/24/22 (!) 96/56   Wt Readings from Last 3 Encounters:  04/22/22 157 lb (71.2 kg)  03/26/22 156 lb 2 oz (70.8 kg)  03/24/22 154 lb 3.2 oz (69.9 kg)      Physical Exam Vitals reviewed.  Constitutional:      Appearance: Normal appearance.  HENT:     Right Ear: Tympanic membrane, ear canal and external ear normal.     Left Ear: Tympanic membrane, ear canal and external ear normal.     Nose: Nasal tenderness (right ethmoid tenderness on palpation), mucosal edema and congestion present. No rhinorrhea.     Right Turbinates: Enlarged and swollen.     Mouth/Throat:     Mouth: Mucous membranes are moist.     Pharynx: Oropharynx is clear. Posterior oropharyngeal erythema present. No oropharyngeal exudate.  Eyes:     General:        Right eye: No discharge.        Left eye: No discharge.  Cardiovascular:     Rate and Rhythm: Normal rate and regular rhythm.     Pulses: Normal pulses.  Heart sounds: Normal heart sounds.  Pulmonary:     Effort: Pulmonary effort is normal.     Breath sounds: Normal breath sounds.  Neurological:     Mental Status: He is alert and oriented to person, place, and time.  Psychiatric:        Mood and Affect: Mood normal.        Behavior: Behavior normal.      No results found for any visits on 04/22/22.     The ASCVD Risk score (Arnett DK, et al., 2019) failed to calculate for the following reasons:   The 2019 ASCVD risk score is only valid for ages 63 to 79    Assessment & Plan:   Problem List Items Addressed This Visit       Respiratory   Seasonal allergic rhinitis - Primary    Pt instructed to start Flonase daily and OTC Cetrizine before bedtime.  Advised to return to office symptoms worsen.       Relevant Medications   predniSONE (DELTASONE) 20 MG tablet     Other   Anxiety state    Stable Continue hydroxyxine 25 mg prn anxiety       Relevant Medications    hydrOXYzine (ATARAX) 25 MG tablet   Sinus pressure    rx prednisone 40 mg once daily for five days   I evaluated the patient,  was consulted regarding plans for treatment of care, and agree with the assessment and plan per Modesto Charon, RN, DNP student.   Mort Sawyers, FNP-C        Return if symptoms worsen or fail to improve.    Modesto Charon, BSN-RN, DNP STUDENT

## 2022-06-08 ENCOUNTER — Ambulatory Visit: Payer: BC Managed Care – PPO | Admitting: Family

## 2022-06-08 VITALS — BP 112/60 | HR 70 | Temp 98.6°F | Resp 16 | Ht 74.0 in | Wt 161.0 lb

## 2022-06-08 DIAGNOSIS — J0110 Acute frontal sinusitis, unspecified: Secondary | ICD-10-CM | POA: Diagnosis not present

## 2022-06-08 DIAGNOSIS — J3089 Other allergic rhinitis: Secondary | ICD-10-CM

## 2022-06-08 MED ORDER — MONTELUKAST SODIUM 10 MG PO TABS: 10.0000 mg | ORAL_TABLET | Freq: Every day | ORAL | 3 refills | Status: DC

## 2022-06-08 NOTE — Assessment & Plan Note (Signed)
Suspected uncontrolled allergies Again recommended flonase daily as well as adding on zyrtec rx sent in for singulair 10 mg as well.  Will treat for possible sinusitis.  Will refer to ent if no improvement.

## 2022-06-08 NOTE — Patient Instructions (Addendum)
  Start Singulair 10 mg (this is RX) sent to pharmacy  Start nightly zyrtec over the counter  And continue flonase daily.   Start prescription of augmentin 875/125 mg and take as prescribed.  Tylenol/ibuprofen ok for sinus pain as needed Increase oral fluids. Ok to continue with humidifers and hot steamy showers as discussed during visit.  It was a pleasure speaking with you today, I hope you start feeling better soon.  Regards,   Eugenia Pancoast

## 2022-06-08 NOTE — Assessment & Plan Note (Signed)
Prescription given for augmentin 875/125 mg po bid for ten days. Pt to continue tylenol/ibuprofen prn sinus pain. Continue with humidifier prn and steam showers recommended as well. instructed If no symptom improvement in 48 hours please f/u ? ?

## 2022-06-08 NOTE — Progress Notes (Signed)
Established Patient Office Visit  Subjective:  Patient ID: Guy Campbell, male    DOB: 1996/02/26  Age: 26 y.o. MRN: [id number]  CC:  Chief Complaint  Patient presents with   Referral    HPI Guy Campbell is here today for follow up.   Still with ongoing post nasal drip, and he feels it in his stomach after draining and he will feel the sensation back in his face as sinus pressure. These sensations are intermittent. He felt this back in July   When the episode comes on he feels very fatigue,a dn as the days go on he will start to feel improved. At times he will feel the sensation again slightly, but then will fade away before worsening.   He is regularly taking flonase daily which helps keeps his nasal passages clear. He is taking benadryl at night time, has not tried any other antihistamine.   9/23 prednisone did help with helped with opening up his airways and nasal passages, however still feels sinus pressure and nasal congestion on left side mainly.     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  Tobacco Use   Smoking status: Never   Smokeless tobacco: Never  Vaping Use   Vaping Use: Never used  Substance and Sexual Activity   Alcohol use: No   Drug use: No   Sexual activity: Yes    Partners: Female    Birth control/protection: Condom  Other Topics Concern   Not on file  Social History Narrative   Not on file    Social Determinants of Health   Financial Resource Strain: Not on file  Food Insecurity: Not on file  Transportation Needs: Not on file  Physical Activity: Not on file  Stress: Not on file  Social Connections: Not on file  Intimate Partner Violence: Not on file    Outpatient Medications Prior to Visit  Medication Sig Dispense Refill   fluticasone (FLONASE) 50 MCG/ACT nasal spray Place 1 spray into both nostrils daily. 47.4 mL 1   hydrOXYzine (ATARAX) 25 MG tablet Take 25 mg by mouth 3 (three) times daily as needed. (Patient not taking: Reported on 06/08/2022)     predniSONE (DELTASONE) 20 MG tablet Take two tablets po qd for five days (Patient not taking: Reported on 06/08/2022) 10 tablet 0   No facility-administered medications prior to visit.    No Known Allergies    Review of Systems  Respiratory:  Negative for shortness of breath.   Cardiovascular:  Negative for chest pain and palpitations.  Gastrointestinal:  Negative for constipation and diarrhea.  Genitourinary:  Negative for dysuria, frequency and urgency.  Musculoskeletal:  Negative for myalgias.  Psychiatric/Behavioral:  Negative for depression and suicidal ideas.   All other systems reviewed and are negative.  Objective:    Physical Exam Constitutional:      General: He is awake. He is not in acute distress.    Appearance: Normal appearance. He is not ill-appearing.  HENT:     Right Ear: A middle ear effusion (clear) is present.     Left Ear: A middle ear effusion (clear) is present.     Nose:     Right Turbinates: Enlarged. Not swollen.     Left Turbinates: Enlarged. Not swollen.     Right Sinus: No maxillary sinus tenderness or frontal sinus tenderness.     Left Sinus: Frontal sinus tenderness present. No maxillary sinus tenderness.     Mouth/Throat:     Mouth: Mucous membranes are moist.     Pharynx: Posterior oropharyngeal erythema (with cobblestoning) present. No pharyngeal swelling or  oropharyngeal exudate.  Eyes:     Extraocular Movements: Extraocular movements intact.     Pupils: Pupils are equal, round, and reactive to light.  Cardiovascular:     Rate and Rhythm: Normal rate and regular rhythm.  Pulmonary:     Effort: Pulmonary effort is normal.     Breath sounds: Normal breath sounds. No wheezing.  Neurological:     Mental Status: He is alert.      BP 112/60   Pulse 70   Temp 98.6 F (37 C)   Resp 16   Ht 6\' 2"  (1.88 m)   Wt 161 lb (73 kg)   SpO2 98%   BMI 20.67 kg/m  Wt Readings from Last 3 Encounters:  06/08/22 161 lb (73 kg)  04/22/22 157 lb (71.2 kg)  03/26/22 156 lb 2 oz (70.8 kg)     Health Maintenance Due  Topic Date Due   HPV VACCINES (1 - Male 2-dose series) Never done   HIV Screening  Never done   Hepatitis C Screening  Never done   TETANUS/TDAP  Never done       Topic Date Due   HPV VACCINES (1 - Male 2-dose series) Never done    Lab Results  Component Value Date   TSH 0.88 02/22/2022   Lab Results  Component Value Date   WBC 4.4 03/26/2022   HGB 15.7 03/26/2022   HCT 45.0 03/26/2022   MCV 92.8 03/26/2022   PLT 209 03/26/2022   Lab Results  Component Value Date   NA 138 02/22/2022   K 4.2 02/22/2022   CO2 29 02/22/2022   GLUCOSE 82 02/22/2022   BUN 12 02/22/2022   CREATININE 1.23 02/22/2022   BILITOT 0.5 02/22/2022   ALKPHOS 48 02/22/2022   AST 18 02/22/2022   ALT 14 02/22/2022   PROT 7.9 02/22/2022   ALBUMIN 5.2 02/22/2022   CALCIUM 10.1 02/22/2022   ANIONGAP 10 11/21/2016   GFR 81.05 02/22/2022   Lab Results  Component Value Date   CHOL 173 03/26/2022   Lab Results  Component Value Date   HDL 65 03/26/2022   Lab Results  Component Value Date   LDLCALC 96 03/26/2022   Lab Results  Component Value Date   TRIG 41 03/26/2022   Lab Results  Component Value Date   CHOLHDL 2.7 03/26/2022   No results found for: "HGBA1C"    Assessment & Plan:   Problem List Items Addressed This Visit        Respiratory   Seasonal allergic rhinitis - Primary    Suspected uncontrolled allergies Again recommended flonase daily as well as adding on zyrtec rx sent in for  singulair 10 mg as well.  Will treat for possible sinusitis.  Will refer to ent if no improvement.      Relevant Medications   montelukast (SINGULAIR) 10 MG tablet   Acute non-recurrent frontal sinusitis    Prescription given for augmentin 875/125 mg po bid for ten days. Pt to continue tylenol/ibuprofen prn sinus pain. Continue with humidifier prn and steam showers recommended as well. instructed If no symptom improvement in 48 hours please f/u        Meds ordered this encounter  Medications   montelukast (SINGULAIR) 10 MG tablet    Sig: Take 1 tablet (10 mg total) by mouth at bedtime.    Dispense:  30 tablet    Refill:  3    Order Specific Question:   Supervising Provider    Answer:   Ermalene Searing, AMY E [[id number]]    Follow-up: Return if symptoms worsen or fail to improve.    Mort Sawyers, FNP

## 2022-06-25 ENCOUNTER — Other Ambulatory Visit: Payer: Self-pay | Admitting: Family

## 2022-06-25 ENCOUNTER — Telehealth: Payer: Self-pay | Admitting: Family

## 2022-06-25 DIAGNOSIS — J0110 Acute frontal sinusitis, unspecified: Secondary | ICD-10-CM

## 2022-06-25 MED ORDER — AMOXICILLIN-POT CLAVULANATE 875-125 MG PO TABS: 1.0000 | ORAL_TABLET | Freq: Two times a day (BID) | ORAL | 0 refills | Status: DC

## 2022-06-25 NOTE — Telephone Encounter (Signed)
Pt picked up medication. Was expecting antibiotic and it was not at pharmacy

## 2022-06-25 NOTE — Telephone Encounter (Signed)
Called and informed pt.  

## 2022-06-25 NOTE — Telephone Encounter (Signed)
Sending in Augmentin even though this was 3 weeks ago.  Please let patient know that the pharmacy

## 2022-12-22 ENCOUNTER — Ambulatory Visit: Payer: BC Managed Care – PPO | Admitting: Family

## 2022-12-23 ENCOUNTER — Encounter: Payer: Self-pay | Admitting: Family

## 2022-12-23 ENCOUNTER — Ambulatory Visit: Payer: BC Managed Care – PPO | Admitting: Family

## 2022-12-23 VITALS — BP 118/74 | HR 56 | Temp 98.0°F | Ht 74.0 in | Wt 159.4 lb

## 2022-12-23 DIAGNOSIS — J329 Chronic sinusitis, unspecified: Secondary | ICD-10-CM | POA: Diagnosis not present

## 2022-12-23 DIAGNOSIS — J3089 Other allergic rhinitis: Secondary | ICD-10-CM

## 2022-12-23 MED ORDER — MONTELUKAST SODIUM 10 MG PO TABS: 10.0000 mg | ORAL_TABLET | Freq: Every day | ORAL | 3 refills | Status: DC

## 2022-12-23 MED ORDER — AMOXICILLIN-POT CLAVULANATE 875-125 MG PO TABS: 1.0000 | ORAL_TABLET | Freq: Two times a day (BID) | ORAL | 0 refills | Status: DC

## 2022-12-23 NOTE — Patient Instructions (Addendum)
  I prefer zyrtec and or Xyzal over the counter.  Recommend starting nasocort and or rhinocort instead of flonase.   Start prescription of augmentin 875/125 mg and take as prescribed.  Tylenol/ibuprofen ok for sinus pain as needed Increase oral fluids. Ok to continue with humidifers and hot steamy showers as discussed during visit.  It was a pleasure speaking with you today, I hope you start feeling better soon.  Regards,   Mort Sawyers

## 2022-12-23 NOTE — Assessment & Plan Note (Signed)
Continue flonase Stop claritin, start zyrtec or xyzal nightly  Rx refill singulair

## 2022-12-23 NOTE — Progress Notes (Signed)
Established Patient Office Visit  Subjective:   Patient ID: Guy Campbell, male    DOB: 02-Oct-1995  Age: 27 y.o. MRN: [id number]  CC:  Chief Complaint  Patient presents with   Sinus Problem    For several months    HPI: Guy Campbell is a 26 y.o. male presenting on 12/23/2022 for Sinus Problem (For several months)   Sinus Problem    States still with post nasal drip and increased mucous that is going in his stomach and causing him Gi upset. He is still taking flonase daily and claritin prn. He was taking singulair but has recently ran out.   10/23 came into the office for similar symptoms and was given augmentin after already being given prednisone prior, this did help with the sinus pressure in his face.   He states also with diarrhea at times, last week with diarrhea which just stopped about five days ago. He states that he slept with the ac on overnight which he typically does not do and the next am he noticed some increased sinus pressure and nasal congestion.        ROS: Negative unless specifically indicated above in HPI.   Relevant past medical history reviewed and updated as indicated.   Allergies and medications reviewed and updated.   Current Outpatient Medications:    amoxicillin-clavulanate (AUGMENTIN) 875-125 MG tablet, Take 1 tablet by mouth 2 (two) times daily., Disp: 20 tablet, Rfl: 0   fluticasone (FLONASE) 50 MCG/ACT nasal spray, Place 1 spray into both nostrils daily., Disp: 47.4 mL, Rfl: 1   montelukast (SINGULAIR) 10 MG tablet, Take 1 tablet (10 mg total) by mouth at bedtime., Disp: 90 tablet, Rfl: 3  No Known Allergies  Objective:   BP 118/74 (BP Location: Left Arm)   Pulse (!) 56   Temp 98 F (36.7 C) (Temporal)   Ht 6\' 2"  (1.88 m)   Wt 159 lb 6.4 oz (72.3 kg)   SpO2 99%   BMI 20.47 kg/m    Physical Exam Vitals reviewed.  Constitutional:      General: He is not in acute distress.    Appearance: Normal appearance. He is normal  weight. He is not ill-appearing, toxic-appearing or diaphoretic.  HENT:     Head: Normocephalic.     Right Ear: A middle ear effusion is present. Tympanic membrane is erythematous.     Left Ear: Tympanic membrane normal.     Nose: Mucosal edema and congestion present.     Left Sinus: Maxillary sinus tenderness present.     Mouth/Throat:     Mouth: Mucous membranes are moist.  Eyes:     Pupils: Pupils are equal, round, and reactive to light.  Cardiovascular:     Rate and Rhythm: Normal rate and regular rhythm.  Pulmonary:     Effort: Pulmonary effort is normal.     Breath sounds: Normal breath sounds. No wheezing.  Musculoskeletal:        General: Normal range of motion.     Cervical back: Normal range of motion.  Neurological:     General: No focal deficit present.     Mental Status: He is alert and oriented to person, place, and time. Mental status is at baseline.  Psychiatric:        Mood and Affect: Mood normal.        Behavior: Behavior normal.        Thought Content: Thought content normal.  Judgment: Judgment normal.     Assessment & Plan:  Recurrent sinus infections -     Ambulatory referral to ENT -     Amoxicillin-Pot Clavulanate; Take 1 tablet by mouth 2 (two) times daily.  Dispense: 20 tablet; Refill: 0  Seasonal allergic rhinitis due to other allergic trigger Assessment & Plan: Continue flonase Stop claritin, start zyrtec or xyzal nightly  Rx refill singulair    Orders: -     Montelukast Sodium; Take 1 tablet (10 mg total) by mouth at bedtime.  Dispense: 90 tablet; Refill: 3     Follow up plan: Return in about 1 year (around 12/23/2023) for f/u CPE.  Mort Sawyers, FNP

## 2022-12-30 ENCOUNTER — Encounter: Payer: Self-pay | Admitting: *Deleted

## 2023-08-16 ENCOUNTER — Encounter: Payer: Self-pay | Admitting: Family

## 2023-08-16 ENCOUNTER — Ambulatory Visit: Payer: BC Managed Care – PPO | Admitting: Family

## 2023-08-16 VITALS — BP 120/80 | HR 69 | Temp 97.7°F | Ht 74.0 in | Wt 172.4 lb

## 2023-08-16 DIAGNOSIS — R0981 Nasal congestion: Secondary | ICD-10-CM | POA: Diagnosis not present

## 2023-08-16 DIAGNOSIS — D72819 Decreased white blood cell count, unspecified: Secondary | ICD-10-CM | POA: Insufficient documentation

## 2023-08-16 DIAGNOSIS — T781XXA Other adverse food reactions, not elsewhere classified, initial encounter: Secondary | ICD-10-CM | POA: Diagnosis not present

## 2023-08-16 DIAGNOSIS — J329 Chronic sinusitis, unspecified: Secondary | ICD-10-CM

## 2023-08-16 DIAGNOSIS — R1012 Left upper quadrant pain: Secondary | ICD-10-CM | POA: Diagnosis not present

## 2023-08-16 DIAGNOSIS — R198 Other specified symptoms and signs involving the digestive system and abdomen: Secondary | ICD-10-CM | POA: Diagnosis not present

## 2023-08-16 LAB — COMPREHENSIVE METABOLIC PANEL
ALT: 13 U/L (ref 0–53)
AST: 17 U/L (ref 0–37)
Albumin: 4.7 g/dL (ref 3.5–5.2)
Alkaline Phosphatase: 45 U/L (ref 39–117)
BUN: 13 mg/dL (ref 6–23)
CO2: 31 meq/L (ref 19–32)
Calcium: 9.7 mg/dL (ref 8.4–10.5)
Chloride: 103 meq/L (ref 96–112)
Creatinine, Ser: 1.11 mg/dL (ref 0.40–1.50)
GFR: 90.73 mL/min (ref >60.00)
Glucose, Bld: 87 mg/dL (ref 70–99)
Potassium: 4.3 meq/L (ref 3.5–5.1)
Sodium: 140 meq/L (ref 135–145)
Total Bilirubin: 0.3 mg/dL (ref 0.2–1.2)
Total Protein: 7.4 g/dL (ref 6.0–8.3)

## 2023-08-16 LAB — CBC WITH DIFFERENTIAL/PLATELET
Basophils Absolute: 0 10*3/uL (ref 0.0–0.1)
Basophils Relative: 0.7 % (ref 0.0–3.0)
Eosinophils Absolute: 0 10*3/uL (ref 0.0–0.7)
Eosinophils Relative: 0.8 % (ref 0.0–5.0)
HCT: 48.1 % (ref 39.0–52.0)
Hemoglobin: 16.1 g/dL (ref 13.0–17.0)
Lymphocytes Relative: 37.6 % (ref 12.0–46.0)
Lymphs Abs: 1.5 10*3/uL (ref 0.7–4.0)
MCHC: 33.4 g/dL (ref 30.0–36.0)
MCV: 97.9 fL (ref 78.0–100.0)
Monocytes Absolute: 0.3 10*3/uL (ref 0.1–1.0)
Monocytes Relative: 7.7 % (ref 3.0–12.0)
Neutro Abs: 2.1 10*3/uL (ref 1.4–7.7)
Neutrophils Relative %: 53.2 % (ref 43.0–77.0)
Platelets: 226 10*3/uL (ref 150.0–400.0)
RBC: 4.91 Mil/uL (ref 4.22–5.81)
RDW: 12.5 % (ref 11.5–15.5)
WBC: 3.9 10*3/uL — ABNORMAL LOW (ref 4.0–10.5)

## 2023-08-16 LAB — AMYLASE: Amylase: 63 U/L (ref 27–131)

## 2023-08-16 LAB — LIPASE: Lipase: 35 U/L (ref 11.0–59.0)

## 2023-08-16 NOTE — Patient Instructions (Signed)
  Get some saline nose spray and continue with Flonase.  Restart zyrtec or xyzal

## 2023-08-16 NOTE — Assessment & Plan Note (Addendum)
 Ongoing.  Amylase lipase cmp and cbc ordered pending results.  Keep an eye on food intake, fod map diet discussed.   Discussed dairy can at times cause productive mucous.  Trial without 14 days.  See if this improves

## 2023-08-16 NOTE — Assessment & Plan Note (Signed)
 Workup with ENT unremarkable.  Restart zyrtec and also flonase  Recommended saline spray

## 2023-08-16 NOTE — Progress Notes (Signed)
 Established Patient Office Visit  Subjective:   Patient ID: FRASER BUSCHE, male    DOB: 1996-06-09  Age: 28 y.o. MRN: [id number]  CC:  Chief Complaint  Patient presents with   Acute Visit    Reports sinus pressure for several months. States that this has been doing on since July 2023. Saw an ENT back in August but they didn't do much for him.    HPI: Guy Campbell is a 28 y.o. male presenting on 08/16/2023 for Acute Visit (Reports sinus pressure for several months. States that this has been doing on since July 2023. Saw an ENT back in August but they didn't do much for him.)  Seeing ENT Dr. Spainhour, for recurrent sinusitis. Ct scan facial bones without contrast with clear paranasl sinuses, no nasal cavity obstruction. He was told by ENT did not need to f/u for this.   Last seen by me 12/2022 ongoing post nasal drip with increased mucous that continued to give him GI upset. He states still with breath 'that smells' and also a lot of mucous that will make him cough up. He states that it will start on his left side of his face and always associated with fullness discomfort on left side of body near the upper left side of rib cage, can extend to midline lateral side. 10/23 had come in with similar symptoms and was treated with augmentin  with improvement in symptoms. When these symptoms occur he states that he notices that his stool is light brown. No longer with diarrheal problem that he had in 2023. He does not feel he has issues with dairy necessarily that he has noticed. He will also feel his left ear will pop when this is going on.   He is only taking flonase  right now for symptoms.  Has tried antihistamines and also singulair  without relief of symptoms.  Denies heart burn symptoms.  No cough.   The episodes will stop for about one week or two, but when the symptoms flare the symptoms will last for a few days and he will feel increased fatigue during this time frame as well.   Ct  abd pelvis 10/03/2001 no acute findings. Infiltrate noted rll Chest xray 11/21/16 no acute abn      ROS: Negative unless specifically indicated above in HPI.   Relevant past medical history reviewed and updated as indicated.   Allergies and medications reviewed and updated.   Current Outpatient Medications:    fluticasone  (FLONASE ) 50 MCG/ACT nasal spray, Place 1 spray into both nostrils daily., Disp: 47.4 mL, Rfl: 1  No Known Allergies  Objective:   BP 120/80 (BP Location: Left Arm, Patient Position: Sitting, Cuff Size: Normal)   Pulse 69   Temp 97.7 F (36.5 C) (Temporal)   Ht 6' 2 (1.88 m)   Wt 172 lb 6.4 oz (78.2 kg)   SpO2 100%   BMI 22.13 kg/m    Physical Exam Vitals reviewed.  Constitutional:      General: He is not in acute distress.    Appearance: Normal appearance. He is obese. He is not ill-appearing, toxic-appearing or diaphoretic.  HENT:     Head: Normocephalic.     Right Ear: Tympanic membrane normal.     Left Ear: A middle ear effusion is present.     Nose: Nose normal.     Right Turbinates: Enlarged and swollen.     Left Turbinates: Enlarged and swollen.     Mouth/Throat:  Mouth: Mucous membranes are moist.     Pharynx: Postnasal drip present.  Eyes:     Pupils: Pupils are equal, round, and reactive to light.  Cardiovascular:     Rate and Rhythm: Normal rate and regular rhythm.  Pulmonary:     Effort: Pulmonary effort is normal.     Breath sounds: Normal breath sounds. No wheezing.  Abdominal:     General: Abdomen is flat. Bowel sounds are normal. There is no distension.     Palpations: There is no mass.     Tenderness: There is no abdominal tenderness.  Musculoskeletal:        General: Normal range of motion.     Cervical back: Normal range of motion.  Neurological:     General: No focal deficit present.     Mental Status: He is alert and oriented to person, place, and time. Mental status is at baseline.  Psychiatric:        Mood and  Affect: Mood normal.        Behavior: Behavior normal.        Thought Content: Thought content normal.        Judgment: Judgment normal.     Assessment & Plan:  Left upper quadrant abdominal pain Assessment & Plan: Ongoing.  Amylase lipase cmp and cbc ordered pending results.  Keep an eye on food intake, fod map diet discussed.   Discussed dairy can at times cause productive mucous.  Trial without 14 days.  See if this improves     Orders: -     CBC with Differential/Platelet -     Amylase -     Lipase -     Comprehensive metabolic panel -     Food Allergy  Profile w/ Reflexes  Nasal congestion  Change in bowel function -     Celiac Disease Panel -     Food Allergy  Profile w/ Reflexes  Gastrointestinal food sensitivity -     Food Allergy  Profile w/ Reflexes  Leukopenia, unspecified type Assessment & Plan: Repeat today pending results.    Recurrent sinus infections Assessment & Plan: Workup with ENT unremarkable.  Restart zyrtec  and also flonase   Recommended saline spray       Follow up plan: Return if symptoms worsen or fail to improve.  Ginger Patrick, FNP

## 2023-08-16 NOTE — Assessment & Plan Note (Signed)
 Repeat today pending results.

## 2023-08-18 ENCOUNTER — Other Ambulatory Visit: Payer: Self-pay | Admitting: Family

## 2023-08-18 DIAGNOSIS — Z91018 Allergy to other foods: Secondary | ICD-10-CM

## 2023-08-18 LAB — FOOD ALLERGY PROFILE W/ REFLEXES
Allergen, Salmon, f41: <0.1 kU/L
Almonds: 0.81 kU/L — ABNORMAL HIGH
CLASS: 0
CLASS: 0
CLASS: 0
CLASS: 0
CLASS: 1
CLASS: 1
CLASS: 2
CLASS: 3
Cashew IgE: 0.14 kU/L — ABNORMAL HIGH
Class: 0
Class: 0
Class: 0
Class: 1
Egg White IgE: <0.1 kU/L
Fish Cod: <0.1 kU/L
Hazelnut: 15.1 kU/L — ABNORMAL HIGH
Milk IgE: <0.1 kU/L
Peanut IgE: 0.46 kU/L — ABNORMAL HIGH
Scallop IgE: <0.1 kU/L
Sesame Seed f10: 0.36 kU/L — ABNORMAL HIGH
Shrimp IgE: <0.1 kU/L
Soybean IgE: 0.2 kU/L — ABNORMAL HIGH
Tuna IgE: <0.1 kU/L
Walnut: 0.23 kU/L — ABNORMAL HIGH
Wheat IgE: 0.59 kU/L — ABNORMAL HIGH

## 2023-08-18 LAB — PEANUT COMPONENT PANEL REFLEX
Ara h 1 (f422): <0.1 kU/L (ref <0.10)
Ara h 2 (f423): <0.1 kU/L (ref <0.10)
Ara h 3 (f424): <0.1 kU/L (ref <0.10)
Ara h 8 (f352): 2.23 kU/L — ABNORMAL HIGH (ref <0.10)
Ara h 9 (f427: <0.1 kU/L (ref <0.10)
F447-IgE Ara h 6: <0.1 kU/L (ref <0.10)

## 2023-08-18 LAB — CELIAC DISEASE PANEL
(tTG) Ab, IgA: <1 U/mL
(tTG) Ab, IgG: <1 U/mL
Gliadin IgA: <1 U/mL
Gliadin IgG: <1 U/mL
Immunoglobulin A: 139 mg/dL (ref 47–310)

## 2023-08-18 LAB — INTERPRETATION:

## 2024-02-04 ENCOUNTER — Encounter (HOSPITAL_COMMUNITY): Payer: Self-pay

## 2024-02-04 ENCOUNTER — Emergency Department (HOSPITAL_COMMUNITY)
Admission: EM | Admit: 2024-02-04 | Discharge: 2024-02-04 | Disposition: A | Discharge status: Home / Self Care | Attending: Student | Admitting: Student

## 2024-02-04 DIAGNOSIS — J0101 Acute recurrent maxillary sinusitis: Secondary | ICD-10-CM | POA: Insufficient documentation

## 2024-02-04 DIAGNOSIS — H6592 Unspecified nonsuppurative otitis media, left ear: Secondary | ICD-10-CM | POA: Insufficient documentation

## 2024-02-04 DIAGNOSIS — H9209 Otalgia, unspecified ear: Secondary | ICD-10-CM | POA: Diagnosis present

## 2024-02-04 MED ORDER — AMOXICILLIN-POT CLAVULANATE 875-125 MG PO TABS: 1.0000 | ORAL_TABLET | Freq: Two times a day (BID) | ORAL | 0 refills | Status: AC

## 2024-02-04 NOTE — ED Provider Notes (Signed)
 [place] EMERGENCY DEPARTMENT AT [facility] Provider Note   CSN: [id number] Arrival date & time: 02/04/24  1844     Patient presents with: Sinus Pressure and Otalgia   Guy Campbell is a 28 y.o. male.  Patient with past history significant for recurrent sinusitis presents to the emergency department concerns of sinus pressure.  He reports he was last on antibiotics for sinus infection in August 2024.  Has followed with ENT through [facility] for evaluation.  Has had unremarkable CT imaging at that time.  States that over the last several days, has had worsening feelings of fullness and pain towards the left side of his face.  States that he has drainage present that he believes is primarily a clear/yellow color.  No reported fever, chills or bodyaches.  Denies any cough.    Otalgia      Prior to Admission medications   Medication Sig Start Date End Date Taking? Authorizing Provider  amoxicillin -clavulanate (AUGMENTIN ) 875-125 MG tablet Take 1 tablet by mouth every 12 (twelve) hours. 02/04/24  Yes Ivar Domangue A, PA-C  fluticasone  (FLONASE ) 50 MCG/ACT nasal spray Place 1 spray into both nostrils daily. 03/26/22   Dugal, Tabitha, FNP    Allergies: Patient has no known allergies.    Review of Systems  HENT:  Positive for ear pain.   All other systems reviewed and are negative.   Updated Vital Signs BP (!) 158/96 (BP Location: Right Arm)   Pulse 68   Temp 98.8 F (37.1 C) (Oral)   Resp 16   Ht 6' 2 (1.88 m)   Wt 78 kg   SpO2 98%   BMI 22.08 kg/m   Physical Exam Vitals and nursing note reviewed.  Constitutional:      General: He is not in acute distress.    Appearance: He is well-developed.  HENT:     Head: Normocephalic and atraumatic.     Comments: TTP along the left maxillary sinus.    Right Ear: Hearing, ear canal and external ear normal. No middle ear effusion.     Left Ear: Ear canal and external ear normal. A middle ear effusion is present.    Eyes:     Conjunctiva/sclera: Conjunctivae normal.    Cardiovascular:     Rate and Rhythm: Normal rate and regular rhythm.     Heart sounds: No murmur heard. Pulmonary:     Effort: Pulmonary effort is normal. No respiratory distress.     Breath sounds: Normal breath sounds.  Abdominal:     Palpations: Abdomen is soft.     Tenderness: There is no abdominal tenderness.   Musculoskeletal:        General: No swelling.     Cervical back: Neck supple.   Skin:    General: Skin is warm and dry.     Capillary Refill: Capillary refill takes less than 2 seconds.   Neurological:     Mental Status: He is alert.   Psychiatric:        Mood and Affect: Mood normal.     (all labs ordered are listed, but only abnormal results are displayed) Labs Reviewed - No data to display  EKG: None  Radiology: No results found.   Procedures   Medications Ordered in the ED - No data to display  Medical Decision Making  This patient presents to the ED for concern of sinus pressure, ear pain.  Differential diagnosis includes bacterial sinusitis, middle ear effusion, otitis media, otitis externa   Problem List / ED Course:  Patient presents to the emergency department today with concerns of sinus pressure and ear pain.  Reports this been ongoing for several weeks.  States she has been dealing with sinus issues for the last 2 years and follows with [facility] ENT.  Last antibiotic dose was in August 2024.  No reported fevers.  Describes sinus drainage as a clear to yellow fluid. On exam, patient has tenderness to palpation of the maxillary sinus.  Examination of ears reveals middle ear effusion to the left side.  No obvious evidence of otitis media or otitis externa.  No mastoid tenderness.  Oropharynx is unremarkable. Findings consistent with bacterial sinusitis.  Will initiate antibiotic therapy with Augmentin  twice daily for the next 7 days.  Encourage  close follow-up with ENT for repeat assessment if symptoms or not improving.  Otherwise stable this time for outpatient follow-up and discharged home with plans for ENT follow up.  Final diagnoses:  Acute recurrent maxillary sinusitis  Fluid level behind tympanic membrane of left ear    ED Discharge Orders          Ordered    amoxicillin -clavulanate (AUGMENTIN ) 875-125 MG tablet  Every 12 hours        02/04/24 2135               Katilyn Miltenberger A, PA-C 02/04/24 2137    Albertina Dixon, MD 02/05/24 [phone]

## 2024-02-04 NOTE — Discharge Instructions (Addendum)
 You were seen in the ER today for concerns of sinus pressure. You appear to have a sinus infection that is currently ongoing and may be recurrent given your history of sinus infections. I am starting you on a prescription for Augmentin  which you will take for the next week. Please take this as prescribed. For any concerns of worsening symptoms, please reach out to your ENT provider for further recommendations or return to the ER.

## 2024-02-04 NOTE — ED Triage Notes (Signed)
 Pt c/o intermittent fullness in sinus cavity L side of nose and L temporal area x2 years.  Pt reports areas feel full, his L ear pops, and then he feels better.   Pt reports he has been seen by PCP and [facility] several times for same w/o diagnosis.
